# Patient Record
Sex: Female | Born: 1970 | Hispanic: No | Marital: Single | Smoking: Never smoker
Health system: Southern US, Community
[De-identification: ages and names within clinical notes are randomized; demographics above are authoritative.]

## PROBLEM LIST (undated history)

## (undated) DIAGNOSIS — J302 Other seasonal allergic rhinitis: Secondary | ICD-10-CM

## (undated) DIAGNOSIS — D219 Benign neoplasm of connective and other soft tissue, unspecified: Secondary | ICD-10-CM

## (undated) DIAGNOSIS — R51 Headache: Secondary | ICD-10-CM

## (undated) DIAGNOSIS — I10 Essential (primary) hypertension: Secondary | ICD-10-CM

## (undated) HISTORY — PX: NO PAST SURGERIES: SHX2092

---

## 2012-01-11 ENCOUNTER — Other Ambulatory Visit (HOSPITAL_COMMUNITY): Payer: Self-pay | Admitting: Obstetrics and Gynecology

## 2012-01-11 ENCOUNTER — Encounter (HOSPITAL_COMMUNITY): Payer: Self-pay | Admitting: *Deleted

## 2012-01-11 NOTE — H&P (Signed)
Yolanda Berry is an 41 y.o. female.gravida 3, para 2, Ab1, with worsening menorrhagia over the previous 5 months.  Menstrual cycles have been prolonged and heavy with clotting.  In addition, she has had dysmenorrhea ibuprofen has been used with with minimal relief.  Hysteroscopy and D&C has been done and the pathology is benign. Because of the prolonged menorrhagia and pelvic pain.  The patient has requested hysterectomy.  Ultrasound as revealed that she has fibroids, which are increasing in size.  The plan is for partial hysterectomy.  Risks, possible complications have been discussed and informed consent has been given.  Pertinent Gynecological History: Menses: flow is excessive with use of many pads or tampons on heaviest days Bleeding: heavy Contraception: Depo-Provera injections DES exposure: denies Blood transfusions: none Sexually transmitted diseases: no past history Previous GYN Procedures: hysteroscopy and D&C  Last mammogram: normal Date: 2013 Last pap: normal Date:2012   Menstrual History: LMP was 12/24/2011    Past Medical History  Diagnosis Date  . Fibroids   . Seasonal allergies   . Headache       Family history, hypertension, mother  And uncles, diabetes  Social History:  does not have a smoking history on file. She does not have any smokeless tobacco history on file. Her alcohol and drug histories not on file.  Allergies: no known allergy  Review of systems as noted, otherwise, noncontributory  Physical exam   general: well developed female, in no acute distress HEENT exam normal Chest clear S1 and S2 clear Back  normal without deviation or CVA tenderness Abdomen bowel sounds are present .  No masses palpated Uterus 10-12 weeks size Adnexa no masses, tenderness Cervix normal  Assessment parous female with enlarged uterus fibroids, menorrhagia, dysmenorrhea that has been worsening over the past 5 months.  Despite ibuprofen use and  Depo-Provera.  Plan hysterectomy    Fortino Sic 01/11/2012, 4:48 PM

## 2012-01-14 ENCOUNTER — Encounter (HOSPITAL_COMMUNITY): Payer: Self-pay | Admitting: Anesthesiology

## 2012-01-14 ENCOUNTER — Inpatient Hospital Stay (HOSPITAL_COMMUNITY): Payer: Medicaid Other | Admitting: Anesthesiology

## 2012-01-14 ENCOUNTER — Encounter (HOSPITAL_COMMUNITY)
Admission: RE | Disposition: A | Discharge status: Home / Self Care | Payer: Self-pay | Source: Ambulatory Visit | Attending: Obstetrics and Gynecology

## 2012-01-14 ENCOUNTER — Inpatient Hospital Stay (HOSPITAL_COMMUNITY)
Admission: RE | Admit: 2012-01-14 | Discharge: 2012-01-17 | DRG: 743 | Disposition: A | Discharge status: Home / Self Care | Payer: Medicaid Other | Source: Ambulatory Visit | Attending: Obstetrics and Gynecology | Admitting: Obstetrics and Gynecology

## 2012-01-14 ENCOUNTER — Encounter (HOSPITAL_COMMUNITY): Payer: Self-pay | Admitting: Obstetrics and Gynecology

## 2012-01-14 ENCOUNTER — Other Ambulatory Visit (HOSPITAL_COMMUNITY): Payer: Self-pay | Admitting: Obstetrics and Gynecology

## 2012-01-14 DIAGNOSIS — N8000 Endometriosis of the uterus, unspecified: Secondary | ICD-10-CM | POA: Diagnosis present

## 2012-01-14 DIAGNOSIS — I10 Essential (primary) hypertension: Secondary | ICD-10-CM | POA: Diagnosis present

## 2012-01-14 DIAGNOSIS — N920 Excessive and frequent menstruation with regular cycle: Principal | ICD-10-CM | POA: Diagnosis present

## 2012-01-14 DIAGNOSIS — Z90710 Acquired absence of both cervix and uterus: Secondary | ICD-10-CM

## 2012-01-14 DIAGNOSIS — N831 Corpus luteum cyst of ovary, unspecified side: Secondary | ICD-10-CM | POA: Diagnosis present

## 2012-01-14 DIAGNOSIS — D259 Leiomyoma of uterus, unspecified: Secondary | ICD-10-CM | POA: Diagnosis present

## 2012-01-14 DIAGNOSIS — N946 Dysmenorrhea, unspecified: Secondary | ICD-10-CM | POA: Diagnosis present

## 2012-01-14 HISTORY — PX: ABDOMINAL HYSTERECTOMY: SHX81

## 2012-01-14 HISTORY — DX: Benign neoplasm of connective and other soft tissue, unspecified: D21.9

## 2012-01-14 HISTORY — DX: Other seasonal allergic rhinitis: J30.2

## 2012-01-14 HISTORY — PX: SALPINGOOPHORECTOMY: SHX82

## 2012-01-14 HISTORY — DX: Headache: R51

## 2012-01-14 LAB — CBC
Hemoglobin: 12.7 g/dL (ref 12.0–15.0)
MCH: 29.5 pg (ref 26.0–34.0)
Platelets: 273 10*3/uL (ref 150–400)
RBC: 4.3 MIL/uL (ref 3.87–5.11)
WBC: 6.4 10*3/uL (ref 4.0–10.5)

## 2012-01-14 LAB — SURGICAL PCR SCREEN: Staphylococcus aureus: NEGATIVE

## 2012-01-14 LAB — SAMPLE TO BLOOD BANK

## 2012-01-14 SURGERY — HYSTERECTOMY, ABDOMINAL
Anesthesia: General | Site: Abdomen | Laterality: Right | Wound class: Clean Contaminated

## 2012-01-14 MED ORDER — GLYCOPYRROLATE 0.2 MG/ML IJ SOLN
INTRAMUSCULAR | Status: AC
  Filled 2012-01-14: qty 1

## 2012-01-14 MED ORDER — KETOROLAC TROMETHAMINE 30 MG/ML IJ SOLN: 15.0000 mg | Freq: Once | INTRAMUSCULAR | Status: DC | PRN

## 2012-01-14 MED ORDER — MIDAZOLAM HCL 5 MG/5ML IJ SOLN
INTRAMUSCULAR | Status: DC | PRN
  Administered 2012-01-14: 2 mg via INTRAVENOUS

## 2012-01-14 MED ORDER — LACTATED RINGERS IV SOLN
INTRAVENOUS | Status: DC
  Administered 2012-01-14 (×3): via INTRAVENOUS

## 2012-01-14 MED ORDER — ACETAMINOPHEN 160 MG/5ML PO SOLN
1000.0000 mg | ORAL | Status: AC
  Administered 2012-01-14: 1000 mg via ORAL
  Filled 2012-01-14: qty 40.6

## 2012-01-14 MED ORDER — MUPIROCIN 2 % EX OINT
TOPICAL_OINTMENT | Freq: Two times a day (BID) | CUTANEOUS | Status: DC
  Administered 2012-01-14: 1 via NASAL

## 2012-01-14 MED ORDER — HYDROMORPHONE HCL PF 1 MG/ML IJ SOLN
INTRAMUSCULAR | Status: AC
  Filled 2012-01-14: qty 1

## 2012-01-14 MED ORDER — ONDANSETRON HCL 4 MG/2ML IJ SOLN
INTRAMUSCULAR | Status: AC
  Filled 2012-01-14: qty 2

## 2012-01-14 MED ORDER — ONDANSETRON HCL 4 MG/2ML IJ SOLN
INTRAMUSCULAR | Status: DC | PRN
  Administered 2012-01-14: 4 mg via INTRAVENOUS

## 2012-01-14 MED ORDER — KETOROLAC TROMETHAMINE 30 MG/ML IJ SOLN
30.0000 mg | Freq: Once | INTRAMUSCULAR | Status: AC
  Administered 2012-01-14: 30 mg via INTRAVENOUS

## 2012-01-14 MED ORDER — FENTANYL CITRATE 0.05 MG/ML IJ SOLN
INTRAMUSCULAR | Status: AC
  Filled 2012-01-14: qty 5

## 2012-01-14 MED ORDER — PROPOFOL 10 MG/ML IV EMUL
INTRAVENOUS | Status: AC
  Filled 2012-01-14: qty 20

## 2012-01-14 MED ORDER — MORPHINE SULFATE 4 MG/ML IJ SOLN
1.0000 mg | INTRAMUSCULAR | Status: DC | PRN
  Administered 2012-01-14 – 2012-01-15 (×4): 2 mg via INTRAVENOUS
  Filled 2012-01-14 (×4): qty 1

## 2012-01-14 MED ORDER — PROPOFOL 10 MG/ML IV EMUL
INTRAVENOUS | Status: DC | PRN
  Administered 2012-01-14: 150 mg via INTRAVENOUS

## 2012-01-14 MED ORDER — ROCURONIUM BROMIDE 50 MG/5ML IV SOLN
INTRAVENOUS | Status: AC
  Filled 2012-01-14: qty 1

## 2012-01-14 MED ORDER — HYDROMORPHONE HCL PF 1 MG/ML IJ SOLN
0.2500 mg | INTRAMUSCULAR | Status: DC | PRN
  Administered 2012-01-14 (×3): 0.5 mg via INTRAVENOUS

## 2012-01-14 MED ORDER — NEOSTIGMINE METHYLSULFATE 1 MG/ML IJ SOLN
INTRAMUSCULAR | Status: DC | PRN
  Administered 2012-01-14: 2.5 mg via INTRAVENOUS

## 2012-01-14 MED ORDER — NEOSTIGMINE METHYLSULFATE 1 MG/ML IJ SOLN
INTRAMUSCULAR | Status: AC
  Filled 2012-01-14: qty 10

## 2012-01-14 MED ORDER — TEMAZEPAM 15 MG PO CAPS: 15.0000 mg | ORAL_CAPSULE | Freq: Every evening | ORAL | Status: DC | PRN

## 2012-01-14 MED ORDER — DEXTROSE IN LACTATED RINGERS 5 % IV SOLN
INTRAVENOUS | Status: DC
  Administered 2012-01-14 – 2012-01-15 (×3): via INTRAVENOUS

## 2012-01-14 MED ORDER — GLYCOPYRROLATE 0.2 MG/ML IJ SOLN
INTRAMUSCULAR | Status: DC | PRN
  Administered 2012-01-14: 0.4 mg via INTRAVENOUS

## 2012-01-14 MED ORDER — DEXAMETHASONE SODIUM PHOSPHATE 4 MG/ML IJ SOLN
INTRAMUSCULAR | Status: DC | PRN
  Administered 2012-01-14: 10 mg via INTRAVENOUS

## 2012-01-14 MED ORDER — BUPIVACAINE ON-Q PAIN PUMP (FOR ORDER SET NO CHG): INJECTION | Status: DC

## 2012-01-14 MED ORDER — CEFAZOLIN SODIUM-DEXTROSE 2-3 GM-% IV SOLR
INTRAVENOUS | Status: AC
  Filled 2012-01-14: qty 50

## 2012-01-14 MED ORDER — MENTHOL 3 MG MT LOZG
1.0000 | LOZENGE | OROMUCOSAL | Status: DC | PRN
  Administered 2012-01-14: 3 mg via ORAL
  Filled 2012-01-14: qty 9

## 2012-01-14 MED ORDER — HYDROMORPHONE HCL PF 1 MG/ML IJ SOLN
INTRAMUSCULAR | Status: DC | PRN
  Administered 2012-01-14 (×2): 1 mg via INTRAVENOUS

## 2012-01-14 MED ORDER — OXYCODONE-ACETAMINOPHEN 5-325 MG PO TABS: 1.0000 | ORAL_TABLET | ORAL | Status: DC | PRN

## 2012-01-14 MED ORDER — ROCURONIUM BROMIDE 100 MG/10ML IV SOLN
INTRAVENOUS | Status: DC | PRN
  Administered 2012-01-14: 40 mg via INTRAVENOUS
  Administered 2012-01-14: 10 mg via INTRAVENOUS

## 2012-01-14 MED ORDER — KETOROLAC TROMETHAMINE 30 MG/ML IJ SOLN
INTRAMUSCULAR | Status: AC
  Filled 2012-01-14: qty 1

## 2012-01-14 MED ORDER — MUPIROCIN 2 % EX OINT
TOPICAL_OINTMENT | CUTANEOUS | Status: AC
  Filled 2012-01-14: qty 22

## 2012-01-14 MED ORDER — KETOROLAC TROMETHAMINE 30 MG/ML IJ SOLN
INTRAMUSCULAR | Status: DC | PRN
  Administered 2012-01-14: 30 mg via INTRAVENOUS

## 2012-01-14 MED ORDER — IBUPROFEN 600 MG PO TABS: 600.0000 mg | ORAL_TABLET | Freq: Four times a day (QID) | ORAL | Status: DC | PRN

## 2012-01-14 MED ORDER — LIDOCAINE HCL (CARDIAC) 20 MG/ML IV SOLN
INTRAVENOUS | Status: DC | PRN
  Administered 2012-01-14: 50 mg via INTRAVENOUS

## 2012-01-14 MED ORDER — NITROGLYCERIN IN D5W 200-5 MCG/ML-% IV SOLN
INTRAVENOUS | Status: AC
  Filled 2012-01-14: qty 250

## 2012-01-14 MED ORDER — FENTANYL CITRATE 0.05 MG/ML IJ SOLN
INTRAMUSCULAR | Status: DC | PRN
  Administered 2012-01-14: 150 ug via INTRAVENOUS
  Administered 2012-01-14: 250 ug via INTRAVENOUS
  Administered 2012-01-14 (×2): 100 ug via INTRAVENOUS

## 2012-01-14 MED ORDER — OXYCODONE-ACETAMINOPHEN 5-325 MG/5ML PO SOLN
5.0000 mL | ORAL | Status: DC | PRN
  Administered 2012-01-14 – 2012-01-15 (×2): 10 mL via ORAL
  Filled 2012-01-14 (×2): qty 10

## 2012-01-14 MED ORDER — DEXAMETHASONE SODIUM PHOSPHATE 10 MG/ML IJ SOLN
INTRAMUSCULAR | Status: AC
  Filled 2012-01-14: qty 1

## 2012-01-14 MED ORDER — FENTANYL CITRATE 0.05 MG/ML IJ SOLN
INTRAMUSCULAR | Status: AC
  Filled 2012-01-14: qty 2

## 2012-01-14 MED ORDER — CEFAZOLIN SODIUM-DEXTROSE 2-3 GM-% IV SOLR
2.0000 g | INTRAVENOUS | Status: AC
  Administered 2012-01-14: 2 g via INTRAVENOUS

## 2012-01-14 MED ORDER — LIDOCAINE HCL (CARDIAC) 20 MG/ML IV SOLN
INTRAVENOUS | Status: AC
  Filled 2012-01-14: qty 5

## 2012-01-14 MED ORDER — MIDAZOLAM HCL 2 MG/2ML IJ SOLN
INTRAMUSCULAR | Status: AC
  Filled 2012-01-14: qty 2

## 2012-01-14 MED FILL — Nitroglycerin IV Soln 200 MCG/ML in D5W: INTRAVENOUS | Qty: 250 | Status: AC

## 2012-01-14 SURGICAL SUPPLY — 32 items
CANISTER SUCTION 2500CC (MISCELLANEOUS) ×3 IMPLANT
CHLORAPREP W/TINT 26ML (MISCELLANEOUS) ×3 IMPLANT
CLOTH BEACON ORANGE TIMEOUT ST (SAFETY) ×3 IMPLANT
CONT PATH 16OZ SNAP LID 3702 (MISCELLANEOUS) ×3 IMPLANT
DISSECTOR SPONGE CHERRY (GAUZE/BANDAGES/DRESSINGS) IMPLANT
DRESSING TELFA 8X3 (GAUZE/BANDAGES/DRESSINGS) ×3 IMPLANT
GAUZE SPONGE 4X4 12PLY STRL LF (GAUZE/BANDAGES/DRESSINGS) ×12 IMPLANT
GAUZE SPONGE 4X4 16PLY XRAY LF (GAUZE/BANDAGES/DRESSINGS) ×3 IMPLANT
GLOVE BIO SURGEON STRL SZ7 (GLOVE) ×3 IMPLANT
GLOVE BIOGEL PI IND STRL 7.0 (GLOVE) ×4 IMPLANT
GLOVE BIOGEL PI INDICATOR 7.0 (GLOVE) ×2
GOWN PREVENTION PLUS LG XLONG (DISPOSABLE) ×3 IMPLANT
GOWN PREVENTION PLUS XXLARGE (GOWN DISPOSABLE) ×3 IMPLANT
GOWN STRL REIN XL XLG (GOWN DISPOSABLE) ×6 IMPLANT
NS IRRIG 1000ML POUR BTL (IV SOLUTION) ×3 IMPLANT
PACK ABDOMINAL GYN (CUSTOM PROCEDURE TRAY) ×3 IMPLANT
PAD ABD 7.5X8 STRL (GAUZE/BANDAGES/DRESSINGS) ×3 IMPLANT
PAD OB MATERNITY 4.3X12.25 (PERSONAL CARE ITEMS) ×3 IMPLANT
PROTECTOR NERVE ULNAR (MISCELLANEOUS) ×3 IMPLANT
SPONGE LAP 18X18 X RAY DECT (DISPOSABLE) ×6 IMPLANT
STAPLER VISISTAT 35W (STAPLE) ×3 IMPLANT
SUT PLAIN 2 0 XLH (SUTURE) ×3 IMPLANT
SUT VIC AB 0 CT1 18XCR BRD8 (SUTURE) ×8 IMPLANT
SUT VIC AB 0 CT1 36 (SUTURE) ×6 IMPLANT
SUT VIC AB 0 CT1 8-18 (SUTURE) ×4
SUT VIC AB 2-0 CT1 (SUTURE) ×6 IMPLANT
SUT VIC AB 4-0 KS 27 (SUTURE) ×3 IMPLANT
SUT VICRYL 0 TIES 12 18 (SUTURE) ×3 IMPLANT
TAPE CLOTH SURG 4X10 WHT LF (GAUZE/BANDAGES/DRESSINGS) ×3 IMPLANT
TOWEL OR 17X24 6PK STRL BLUE (TOWEL DISPOSABLE) ×6 IMPLANT
TRAY FOLEY CATH 14FR (SET/KITS/TRAYS/PACK) ×3 IMPLANT
WATER STERILE IRR 1000ML POUR (IV SOLUTION) ×3 IMPLANT

## 2012-01-14 NOTE — H&P (Signed)
h & P updated. No change.

## 2012-01-14 NOTE — Anesthesia Postprocedure Evaluation (Signed)
  Anesthesia Post-op Note  Patient: Yolanda Berry  Procedure(s) Performed: Procedure(s) (LRB): HYSTERECTOMY ABDOMINAL (N/A) SALPINGO OOPHERECTOMY (Right)  Patient is awake, responsive, moving her legs, and has signs of resolution of her numbness. Pain and nausea are reasonably well controlled. Vital signs are stable and clinically acceptable. Oxygen saturation is clinically acceptable. There are no apparent anesthetic complications at this time. Patient is ready for discharge.

## 2012-01-14 NOTE — Op Note (Signed)
01/14/2012  1:57 PM  PATIENT indications and consent:  Yolanda Berry  41 y.o. femalewith the enlarging fibroids and worsening menorrhagia, hysteroscopy and D&C are performed to the pathology was benign.  The patient has requested him surgical treatment.  Because of the worsening periods as well as the enlarging fibroid uterus.  Patient has requested hysterectomy, wrist, possible complications have been discussed and informed consent has been given.  PRE-OPERATIVE DIAGNOSIS:  Menorrhagia and Fibroids  POST-OPERATIVE DIAGNOSIS:  Menorrhagia and Fibroids  PROCEDURE:  Procedure(s): HYSTERECTOMY ABDOMINAL,RSO  The patient was placed on the table in supine position.  General anesthesia was induced.  The abdomen was sterilely prepped and draped in the usual fashion.  A Pfannenstiel incision was made into the skin and extended through the abdominal layers without difficulty.  We entered the peritoneum and enlarged uterus present with multiple fibroids.  There was a large 6-7 cm fibroid on the right and that was intra-ligamentary displacing the uterus off to the patient's left.the procedure was initiated by placing a suture in the top of the uterine fundus and placed in traction.  The uterus.  The left round ligament was identified, clamped and tied, and the anterior leaf of the broad was entered.  The left adnexa.Then the left adnexal pedicle was clamped, cut, and doubly tied . The bladder was reflected downward on the left improvement in the right.  The right round ligament was stretched over the fibroid.  This was clamped, cut, and tied, and the peritoneum was reflected downward.  The right tube and ovary were draped over the large him fibroid and could not be saved therefore, the infundibulum ligament was identified and the peritoneum was entered and the infundibulopelvic ligament was tied after being cut off ties with 0 Vicryl suture.  A second suture was placed in this fibroid and was retracted up and we  carefully worked our way around this fibroid clamping cutting, time until we were able to reach to the cervix beneath the lateral was reflected downward on the right side as we approached the area.  We were dissecting around the fibroid.  We reached the cervix.  A straight Haney was placed on the pedicle and then was cut and tied and released.  He continued to work our way down to the cardinal ligament and cutting clamp.  After clamping, cutting and tying this we were able to sleep.  The cervix intercurrent Haney was placed on on Detrol, who went to the other side until the straight Haney on the uterine vessels.  These I until we worked our way down to the cervix limits Haney was placed on the cuff.  Then, using the heavy scissors.  The cuff was incised.  In the uterus and right tube and ovary was handed off.  Richardson angle stitches were placed bilaterally and the cuff was closed using interrupted figure-of-eight sutures.  Hemostasis was excellent.  The pelvis was irrigated and suctioned out.  The ureters were examined and peristalsed and appeared intact and away from the margins of the incision.  At this point, the peritoneum was closed with 2-0 Vicryl in a running fashion.  The fascia was closed with 1-0 Vicryl.  This was done in a running, locked fashion.  The subcutaneous was closed with 20 plain in a running fashion.  The skin was approximated with 4, Vicryl on Keith needle.  The procedure was terminated and the patient was taken to recovery room in good condition.   SURGEON:  Surgeon(s): Lou Miner  Neva Seat, MD  PHYSICIAN ASSISTANT: none  ASSISTANTS: none none  ANESTHESIA:   general  EBL:  Total I/O In: 1700 [I.V.:1700] Out: 650 [Urine:450; Blood:200]  BLOOD ADMINISTERED:none  DRAINS: Urinary Catheter (Foley)   LOCAL MEDICATIONS USED:  NONE  SPECIMEN:  Source of Specimen:  uterus, right tube and ovary  DISPOSITION OF SPECIMEN:  PATHOLOGY  COUNTS:  YES   DICTATION: .Dragon  Dictation  PLAN OF CARE: Admit to inpatient   PATIENT DISPOSITION:  PACU - hemodynamically stable.   Delay start of Pharmacological VTE agent (>24hrs) due to surgical blood loss or risk of bleeding:  {YES/NO/NOT APPLICABLE:20182

## 2012-01-14 NOTE — Anesthesia Preprocedure Evaluation (Signed)
Anesthesia Evaluation  Patient identified by MRN, date of birth, ID band Patient awake    Reviewed: Allergy & Precautions, H&P , NPO status , Patient's Chart, lab work & pertinent test results, reviewed documented beta blocker date and time   History of Anesthesia Complications Negative for: history of anesthetic complications  Airway Mallampati: II TM Distance: >3 FB Neck ROM: full    Dental  (+) Teeth Intact and Upper Dentures   Pulmonary neg pulmonary ROS,  breath sounds clear to auscultation  Pulmonary exam normal       Cardiovascular Exercise Tolerance: Good negative cardio ROS  Rhythm:regular Rate:Normal     Neuro/Psych negative neurological ROS  negative psych ROS   GI/Hepatic negative GI ROS, Neg liver ROS,   Endo/Other  negative endocrine ROS  Renal/GU negative Renal ROS     Musculoskeletal   Abdominal   Peds  Hematology negative hematology ROS (+)   Anesthesia Other Findings   Reproductive/Obstetrics negative OB ROS                           Anesthesia Physical Anesthesia Plan  ASA: I  Anesthesia Plan: General ETT   Post-op Pain Management:    Induction:   Airway Management Planned:   Additional Equipment:   Intra-op Plan:   Post-operative Plan:   Informed Consent: I have reviewed the patients History and Physical, chart, labs and discussed the procedure including the risks, benefits and alternatives for the proposed anesthesia with the patient or authorized representative who has indicated his/her understanding and acceptance.   Dental Advisory Given  Plan Discussed with: CRNA and Surgeon  Anesthesia Plan Comments:         Anesthesia Quick Evaluation

## 2012-01-14 NOTE — Anesthesia Procedure Notes (Signed)
Procedure Name: Intubation Date/Time: 01/14/2012 11:55 AM Performed by: Shanon Payor Pre-anesthesia Checklist: Suction available, Timeout performed, Emergency Drugs available, Patient identified and Patient being monitored Patient Re-evaluated:Patient Re-evaluated prior to inductionOxygen Delivery Method: Circle system utilized Preoxygenation: Pre-oxygenation with 100% oxygen Intubation Type: IV induction Ventilation: Mask ventilation without difficulty Laryngoscope Size: Mac and 3 Grade View: Grade II Tube type: Oral Tube size: 7.0 mm Number of attempts: 1 Airway Equipment and Method: Stylet Placement Confirmation: ETT inserted through vocal cords under direct vision,  positive ETCO2 and breath sounds checked- equal and bilateral Secured at: 20 cm Tube secured with: Tape Dental Injury: Teeth and Oropharynx as per pre-operative assessment

## 2012-01-14 NOTE — Progress Notes (Signed)
Pt has been given Dilaudid 0.5 mg IV x 3 doses(1500,1510,1525).  Patient must be moderately stimulated to assess pain score.  States pain is still 7/10 although she does not appear to be in severe pain.  Does grimace slightly with activity.  Patient was able to lift bottom of bed while putting on panties without showing signs of severe pain.  Patient resting with respiratory rate of 8.  Will continue to monitor.  No additional pain meds given at this time.

## 2012-01-14 NOTE — Transfer of Care (Signed)
Immediate Anesthesia Transfer of Care Note  Patient: Yolanda Berry  Procedure(s) Performed: Procedure(s) (LRB): HYSTERECTOMY ABDOMINAL (N/A) SALPINGO OOPHERECTOMY (Right)  Patient Location: PACU  Anesthesia Type: General  Level of Consciousness: awake, alert  and oriented  Airway & Oxygen Therapy: Patient Spontanous Breathing and Patient connected to nasal cannula oxygen  Post-op Assessment: Report given to PACU RN and Post -op Vital signs reviewed and stable  Post vital signs: Reviewed and stable  Complications: No apparent anesthesia complications

## 2012-01-14 NOTE — Progress Notes (Signed)
Dr Cristela Blue called re: pt's stated pain level.  Discussed alternatives to giving IV narcotics secondary to pt's sedation.  Orders received for Toradol 30 mg IV and Acetaminophen 1 gm PO.

## 2012-01-14 NOTE — Progress Notes (Signed)
Dr Cristela Blue notified of hypertension in PACU.  Last 3 BP readings in 170's/90 range.  No new orders received.  To notify if SBP>190.

## 2012-01-15 ENCOUNTER — Encounter (HOSPITAL_COMMUNITY): Payer: Self-pay | Admitting: Obstetrics and Gynecology

## 2012-01-15 LAB — CBC
HCT: 29.5 % — ABNORMAL LOW (ref 36.0–46.0)
Hemoglobin: 10 g/dL — ABNORMAL LOW (ref 12.0–15.0)
MCH: 30.6 pg (ref 26.0–34.0)
MCHC: 33.9 g/dL (ref 30.0–36.0)
MCV: 90.2 fL (ref 78.0–100.0)
Platelets: 256 10*3/uL (ref 150–400)
RBC: 3.27 MIL/uL — ABNORMAL LOW (ref 3.87–5.11)
RDW: 13.3 % (ref 11.5–15.5)
WBC: 10.1 10*3/uL (ref 4.0–10.5)

## 2012-01-15 MED ORDER — OXYCODONE-ACETAMINOPHEN 5-325 MG PO TABS
1.0000 | ORAL_TABLET | ORAL | Status: DC | PRN
  Administered 2012-01-15 (×3): 2 via ORAL
  Administered 2012-01-16 (×2): 1 via ORAL
  Filled 2012-01-15: qty 2
  Filled 2012-01-15 (×2): qty 1
  Filled 2012-01-15 (×2): qty 2

## 2012-01-15 MED ORDER — IBUPROFEN 600 MG PO TABS
600.0000 mg | ORAL_TABLET | Freq: Four times a day (QID) | ORAL | Status: DC | PRN
Start: 2012-01-15 — End: 2012-01-17
  Administered 2012-01-15 – 2012-01-16 (×4): 600 mg via ORAL
  Filled 2012-01-15 (×4): qty 1

## 2012-01-15 MED ORDER — IBUPROFEN 100 MG/5ML PO SUSP
600.0000 mg | Freq: Four times a day (QID) | ORAL | Status: DC | PRN
  Administered 2012-01-15: 600 mg via ORAL
  Filled 2012-01-15: qty 30

## 2012-01-15 MED ORDER — ONDANSETRON 8 MG PO TBDP
8.0000 mg | ORAL_TABLET | Freq: Two times a day (BID) | ORAL | Status: DC | PRN
  Administered 2012-01-15: 8 mg via ORAL
  Filled 2012-01-15: qty 1

## 2012-01-15 NOTE — Addendum Note (Signed)
Addendum  created 01/15/12 1320 by Randa Spike, CRNA   Modules edited:Notes Section

## 2012-01-15 NOTE — Progress Notes (Addendum)
1 Day Post-Op Procedure(s) (LRB): HYSTERECTOMY ABDOMINAL (N/A) SALPINGO OOPHERECTOMY (Right)  Subjective: Patient reports incisional pain.  And some abdominal pain esp associated with eating and was told to not force feed herself and she has not been out of bed and this was strongly encouraged  Objective: I have reviewed patient's vital signs, intake and output and labs.  General: alert, cooperative and mild distress Resp: clear to auscultation bilaterally Cardio: regular rate and rhythm, S1, S2 normal, no murmur, click, rub or gallop GI: appropriately tender and with minimal tympany but has a generalized tenderness throughout Extremities: extremities normal, atraumatic, no cyanosis or edema and Homans sign is negative, no sign of DVT  Assessment: s/p Procedure(s) (LRB): HYSTERECTOMY ABDOMINAL (N/A) SALPINGO OOPHERECTOMY (Right): stable  Plan: Encourage ambulation  LOS: 1 day    Princess Karnes H. 01/15/2012, 11:01 AM  CBC    Component Value Date/Time   WBC 10.1 01/15/2012 0510   RBC 3.27* 01/15/2012 0510   HGB 10.0* 01/15/2012 0510   HCT 29.5* 01/15/2012 0510   PLT 256 01/15/2012 0510   MCV 90.2 01/15/2012 0510   MCH 30.6 01/15/2012 0510   MCHC 33.9 01/15/2012 0510   RDW 13.3 01/15/2012 0510   Pathology Patient Vitals for the past 24 hrs:  BP Temp Temp src Pulse Resp SpO2 Height Weight  01/15/12 0954 173/93 mmHg 98.5 F (36.9 C) Oral 59  18  98 % - -  01/15/12 0617 152/92 mmHg 98.8 F (37.1 C) Oral 58  17  97 % - -  01/15/12 0200 140/88 mmHg 98.1 F (36.7 C) Oral 69  16  97 % - -  01/14/12 2148 142/91 mmHg 98.8 F (37.1 C) Oral 73  17  98 % - -  01/14/12 1759 149/86 mmHg 98.5 F (36.9 C) - 66  12  96 % 5' (1.524 m) 67.132 kg (148 lb)  01/14/12 1705 140/95 mmHg 98.5 F (36.9 C) - 85  10  99 % - -  01/14/12 1645 150/90 mmHg - - 70  10  95 % - -  01/14/12 1630 145/89 mmHg 98.6 F (37 C) - 70  10  95 % - -  01/14/12 1615 156/94 mmHg - - 111  9  100 % - -  01/14/12 1600  153/86 mmHg - - 74  9  100 % - -  01/14/12 1545 140/86 mmHg - - 70  9  100 % - -  01/14/12 1530 158/96 mmHg 99 F (37.2 C) - 71  7  99 % - -  01/14/12 1515 161/87 mmHg - - 67  9  100 % - -  01/14/12 1510 - - - 76  15  100 % - -  01/14/12 1500 168/90 mmHg - - 69  9  100 % - -  01/14/12 1445 174/94 mmHg - - 64  10  100 % - -  01/14/12 1430 180/88 mmHg - - 74  18  100 % - -  01/14/12 1421 170/90 mmHg 98.1 F (36.7 C) - 67  7  99 % - -    A: increased BP  P: monitor and may need therapy

## 2012-01-15 NOTE — Anesthesia Postprocedure Evaluation (Signed)
Alert and oriented, VS stable NO complaints of sore throat, myalgias or recall of surgery. Very happy with Anesthesia care

## 2012-01-15 NOTE — Progress Notes (Signed)
UR Chart review completed.  

## 2012-01-16 MED ORDER — BISACODYL 10 MG RE SUPP
10.0000 mg | Freq: Once | RECTAL | Status: AC
  Administered 2012-01-16: 10 mg via RECTAL
  Filled 2012-01-16: qty 1

## 2012-01-16 MED ORDER — LACTATED RINGERS IV BOLUS (SEPSIS)
1000.0000 mL | Freq: Once | INTRAVENOUS | Status: AC
  Administered 2012-01-16: 1000 mL via INTRAVENOUS

## 2012-01-16 NOTE — Progress Notes (Addendum)
2 Days Post-Op Procedure(s) (LRB): HYSTERECTOMY ABDOMINAL (N/A) SALPINGO OOPHERECTOMY (Right)  Subjective: Patient reports nausea, vomiting, tolerating PO and no problems voiding.  Emesis x 1 yesterday. Also c/o headache and states that she did void and ambulate and no flatus but was able to keep some food down Also has no IV fliuds   Objective: I have reviewed patient's vital signs, intake and output, medications and labs.  General: alert, cooperative and no distress Resp: clear to auscultation bilaterally Cardio: regular rate and rhythm, S1, S2 normal, no murmur, click, rub or gallop GI: soft, non-tender; bowel sounds normal; no masses,  no organomegaly and incision: clean, dry and intact Extremities: extremities normal, atraumatic, no cyanosis or edema Bowel sounds are hypoactive and no tympany noted either  Assessment: s/p Procedure(s) (LRB): HYSTERECTOMY ABDOMINAL (N/A) SALPINGO OOPHERECTOMY (Right): stable  Plan: Encourage ambulation and await return of GI function  LOS: 2 days   Bolus LR one liter Idil Maslanka H. 01/16/2012, 10:53 AM    Temp:  [98.1 F (36.7 C)-99 F (37.2 C)] 98.4 F (36.9 C) (08/21 0502) Pulse Rate:  [64-83] 80  (08/21 0502) Resp:  [16-18] 16  (08/21 0502) BP: (116-164)/(75-94) 146/92 mmHg (08/21 0502) SpO2:  [98 %-100 %] 100 % (08/21 0502)  CBC    Component Value Date/Time   WBC 10.1 01/15/2012 0510   RBC 3.27* 01/15/2012 0510   HGB 10.0* 01/15/2012 0510   HCT 29.5* 01/15/2012 0510   PLT 256 01/15/2012 0510   MCV 90.2 01/15/2012 0510   MCH 30.6 01/15/2012 0510   MCHC 33.9 01/15/2012 0510   RDW 13.3 01/15/2012 0510   I/O last 3 completed shifts: In: -  Out: 2375 [Urine:2375]

## 2012-01-17 MED ORDER — OXYCODONE-ACETAMINOPHEN 5-325 MG PO TABS: 1.0000 | ORAL_TABLET | ORAL | Status: AC | PRN

## 2012-01-17 NOTE — Progress Notes (Signed)
Patient ID: Yolanda Berry, female   DOB: [birth year], 41 y.o.   MRN: [id number] See discharge summary

## 2012-01-17 NOTE — Discharge Summary (Signed)
Physician Discharge Summary  Patient ID: Yolanda Berry MRN: [id number] DOB/AGE: 1970-08-21 41 y.o.  Admit date: 01/14/2012 Discharge date: 01/17/2012  Admission Diagnoses:enlarged uterus fibroids,  menorrhagia,  dysmenorrhea .  Planned hysterectomy  Principal procedure: TAH RSO  Discharge Diagnoses:  Active Problems:  * No active hospital problems. *   Same plus status post TAH RSO Transient hypertension postoperatively resolved Discharged Condition: good  Hospital Course:Yolanda Berry 41 y.o. femalewith the enlarging fibroids and worsening menorrhagia, hysteroscopy and D&C are performed to the pathology was benign. The patient has requested him surgical treatment. Because of the worsening periods as well as the enlarging fibroid uterus. Patient has requested hysterectomy, risk, possible complications have been discussed and informed consent has been given.   The patient underwent a TAH RSO without difficulty and had a recovery that was within normal limits.:  Difficulty she had very recovery was some blood pressures.  She has no history of hypertension.  This was monitored and seem to resolve as her pain control improved Consults: None  Significant Diagnostic Studies:  pathology pending  Treatments: surgery: TAH RSO  Discharge Exam: Blood pressure 118/67, pulse 82, temperature 98 F (36.7 C), temperature source Oral, resp. rate 17, height 5' (1.524 m), weight 67.132 kg (148 lb), SpO2 97.00%. General appearance: alert, cooperative and no distress Back: no tenderness to percussion or palpation, symmetric, no curvature. ROM normal. No CVA tenderness. Resp: clear to auscultation bilaterally Cardio: regular rate and rhythm, S1, S2 normal, no murmur, click, rub or gallop GI: soft, non-tender; bowel sounds normal; no masses,  no organomegaly and Appropriately tender Extremities: extremities normal, atraumatic, no cyanosis or edema, Homans sign is negative, no sign of DVT and no edema,  redness or tenderness in the calves or thighs Incision/Wound: with Steri-Strips and without evidence of infection or increased tenderness or discharge  Disposition: 01-Home or Self Care  Discharge Orders    Future Orders Please Complete By Expires   Diet - low sodium heart healthy      Increase activity slowly      Discharge instructions      Comments:   Pt counseled regarding activity and diet and lifting and followup   Driving Restrictions      Comments:   None for 2 weeks   Lifting restrictions      Comments:   Nothing you have to stop talking to lift   Sexual Activity Restrictions      Comments:   None for 2 weeks   Discharge wound care:      Comments:   Take steri strips off in 7 days if still there and call for any problems with the wound   Call MD for:  extreme fatigue      Call MD for:  persistant dizziness or light-headedness      Call MD for:  hives      Call MD for:  difficulty breathing, headache or visual disturbances      Call MD for:  redness, tenderness, or signs of infection (pain, swelling, redness, odor or green/yellow discharge around incision site)      Call MD for:  severe uncontrolled pain      Call MD for:  persistant nausea and vomiting      Call MD for:  temperature >100.4        Medication List  As of 01/17/2012  1:09 PM   STOP taking these medications         medroxyPROGESTERone 150 MG/ML injection  TAKE these medications         diphenhydrAMINE 25 MG tablet   Commonly known as: BENADRYL   Take 50 mg by mouth daily as needed. For allergies      ibuprofen 200 MG tablet   Commonly known as: ADVIL,MOTRIN   Take 200 mg by mouth every 6 (six) hours as needed. For pain      multivitamin with minerals Tabs   Take 1 tablet by mouth daily.      oxyCODONE-acetaminophen 5-325 MG per tablet   Commonly known as: PERCOCET/ROXICET   Take 1-2 tablets by mouth every 4 (four) hours as needed.             SignedDelbert Harness. 01/17/2012, 1:09 PM

## 2012-01-27 ENCOUNTER — Other Ambulatory Visit (HOSPITAL_COMMUNITY): Payer: Self-pay | Admitting: Obstetrics and Gynecology

## 2012-01-27 NOTE — H&P (Signed)
Fortino Sic, MD Physician Signed  H&P 01/11/2012 4:48 PM  Related encounter: Orders Only from 01/11/2012 in [facility]   Yolanda Berry is an 41 y.o. female.gravida 3, para 2, Ab1, with worsening menorrhagia over the previous 5 months. Menstrual cycles have been prolonged and heavy with clotting. In addition, she has had dysmenorrhea ibuprofen has been used with with minimal relief. Hysteroscopy and D&C has been done and the pathology is benign. Because of the prolonged menorrhagia and pelvic pain. The patient has requested hysterectomy. Ultrasound as revealed that she has fibroids, which are increasing in size. The plan is for partial hysterectomy. Risks, possible complications have been discussed and informed consent has been given.  Pertinent Gynecological History:  Menses: flow is excessive with use of many pads or tampons on heaviest days  Bleeding: heavy  Contraception: Depo-Provera injections  DES exposure: denies  Blood transfusions: none  Sexually transmitted diseases: no past history  Previous GYN Procedures: hysteroscopy and D&C  Last mammogram: normal Date: 2013  Last pap: normal Date:2012  Menstrual History:  LMP was 12/24/2011     Past Medical History    Diagnosis  Date    .  Fibroids     .  Seasonal allergies     .  Headache      Family history, hypertension, mother  And uncles, diabetes  Social History: does not have a smoking history on file. She does not have any smokeless tobacco history on file. Her alcohol and drug histories not on file.  Allergies: no known allergy  Review of systems as noted, otherwise, noncontributory  Physical exam  general: well developed female, in no acute distress  HEENT exam normal  Chest clear  S1 and S2 clear  Back  normal without deviation or CVA tenderness  Abdomen bowel sounds are present  . No masses palpated  Uterus 10-12 weeks size  Adnexa no masses, tenderness  Cervix normal  Assessment parous female with  enlarged uterus fibroids, menorrhagia, dysmenorrhea that has been worsening over the past 5 months. Despite ibuprofen use and Depo-Provera.  Plan hysterectomy  Fortino Sic  01/11/2012, 4:48 PM

## 2013-10-07 ENCOUNTER — Emergency Department (HOSPITAL_COMMUNITY): Payer: Medicaid Other

## 2013-10-07 ENCOUNTER — Encounter (HOSPITAL_COMMUNITY): Payer: Self-pay | Admitting: Emergency Medicine

## 2013-10-07 DIAGNOSIS — R079 Chest pain, unspecified: Secondary | ICD-10-CM | POA: Insufficient documentation

## 2013-10-07 LAB — CBC
HEMATOCRIT: 35.6 % — AB (ref 36.0–46.0)
HEMOGLOBIN: 12 g/dL (ref 12.0–15.0)
MCH: 30.4 pg (ref 26.0–34.0)
MCHC: 33.7 g/dL (ref 30.0–36.0)
MCV: 90.1 fL (ref 78.0–100.0)
Platelets: 242 10*3/uL (ref 150–400)
RBC: 3.95 MIL/uL (ref 3.87–5.11)
RDW: 13.2 % (ref 11.5–15.5)
WBC: 7.2 10*3/uL (ref 4.0–10.5)

## 2013-10-07 LAB — BASIC METABOLIC PANEL
BUN: 12 mg/dL (ref 6–23)
CHLORIDE: 104 meq/L (ref 96–112)
CO2: 21 mEq/L (ref 19–32)
Calcium: 8.8 mg/dL (ref 8.4–10.5)
Creatinine, Ser: 0.88 mg/dL (ref 0.50–1.10)
GFR calc non Af Amer: 79 mL/min — ABNORMAL LOW (ref >90)
Glucose, Bld: 91 mg/dL (ref 70–99)
POTASSIUM: 3.8 meq/L (ref 3.7–5.3)
Sodium: 138 mEq/L (ref 137–147)

## 2013-10-07 LAB — I-STAT TROPONIN, ED: Troponin i, poc: 0 ng/mL (ref 0.00–0.08)

## 2013-10-07 NOTE — ED Notes (Signed)
Pt sattes burning in her central chest that started while she was sitting at home.

## 2013-10-08 ENCOUNTER — Emergency Department (HOSPITAL_COMMUNITY)
Admission: EM | Admit: 2013-10-08 | Discharge: 2013-10-08 | Discharge status: Against Medical Advice | Payer: Medicaid Other | Attending: Emergency Medicine | Admitting: Emergency Medicine

## 2013-11-26 ENCOUNTER — Encounter (HOSPITAL_COMMUNITY): Payer: Self-pay | Admitting: Emergency Medicine

## 2013-11-26 ENCOUNTER — Emergency Department (HOSPITAL_COMMUNITY)
Admission: EM | Admit: 2013-11-26 | Discharge: 2013-11-26 | Disposition: A | Discharge status: Home / Self Care | Payer: Medicaid Other | Attending: Emergency Medicine | Admitting: Emergency Medicine

## 2013-11-26 DIAGNOSIS — Z8542 Personal history of malignant neoplasm of other parts of uterus: Secondary | ICD-10-CM | POA: Insufficient documentation

## 2013-11-26 DIAGNOSIS — R110 Nausea: Secondary | ICD-10-CM | POA: Insufficient documentation

## 2013-11-26 DIAGNOSIS — G43911 Migraine, unspecified, intractable, with status migrainosus: Secondary | ICD-10-CM | POA: Diagnosis not present

## 2013-11-26 MED ORDER — METOCLOPRAMIDE HCL 5 MG/ML IJ SOLN
10.0000 mg | Freq: Once | INTRAMUSCULAR | Status: AC
  Administered 2013-11-26: 10 mg via INTRAVENOUS
  Filled 2013-11-26: qty 2

## 2013-11-26 MED ORDER — DIPHENHYDRAMINE HCL 50 MG/ML IJ SOLN
25.0000 mg | Freq: Once | INTRAMUSCULAR | Status: AC
  Administered 2013-11-26: 25 mg via INTRAVENOUS
  Filled 2013-11-26: qty 1

## 2013-11-26 MED ORDER — DEXAMETHASONE SODIUM PHOSPHATE 10 MG/ML IJ SOLN
10.0000 mg | Freq: Once | INTRAMUSCULAR | Status: AC
  Administered 2013-11-26: 10 mg via INTRAVENOUS
  Filled 2013-11-26: qty 1

## 2013-11-26 NOTE — Discharge Instructions (Signed)

## 2013-11-26 NOTE — Progress Notes (Signed)
[facility] provided pt with a list of primary care resources to help patient establish a pcp.  °

## 2013-11-26 NOTE — ED Provider Notes (Addendum)
CSN: [id number]     Arrival date & time 11/26/13  1021 History   First MD Initiated Contact with Patient 11/26/13 1035     Chief Complaint  Patient presents with  . Migraine     (Consider location/radiation/quality/duration/timing/severity/associated sxs/prior Treatment) Patient is a 43 y.o. female presenting with migraines. The history is provided by the patient.  Migraine This is a recurrent problem. Episode onset: 1 week. The problem occurs constantly. The problem has been gradually worsening. Associated symptoms include headaches. Associated symptoms comments: Photosensitivity, nausea, eyes watering.  No vomiting, fever, neck pain or focal weakness/numbness. Exacerbated by: bright light. Nothing relieves the symptoms. Treatments tried: NSAIDs. The treatment provided no relief.    Past Medical History  Diagnosis Date  . Fibroids   . Seasonal allergies   . MHDQQIWL(798.9)    Past Surgical History  Procedure Laterality Date  . No past surgeries    . Abdominal hysterectomy  01/14/2012    Procedure: HYSTERECTOMY ABDOMINAL;  Surgeon: Avel Sensor, MD;  Location: [facility];  Service: Gynecology;  Laterality: N/A;  . Salpingoophorectomy  01/14/2012    Procedure: SALPINGO OOPHERECTOMY;  Surgeon: Avel Sensor, MD;  Location: [facility];  Service: Gynecology;  Laterality: Right;   No family history on file. History  Substance Use Topics  . Smoking status: Never Smoker   . Smokeless tobacco: Never Used  . Alcohol Use: No   OB History   Grav Para Term Preterm Abortions TAB SAB Ect Mult Living                 Review of Systems  Neurological: Positive for headaches.  All other systems reviewed and are negative.     Allergies  Review of patient's allergies indicates no known allergies.  Home Medications   Prior to Admission medications   Medication Sig Start Date End Date Taking? Authorizing Provider  diphenhydrAMINE (BENADRYL) 25 MG tablet Take 50 mg by mouth daily as  needed. For allergies    Historical Provider, MD   BP 127/88  Pulse 82  Temp(Src) 98.5 F (36.9 C) (Oral)  Resp 17  SpO2 99% Physical Exam  Nursing note and vitals reviewed. Constitutional: She is oriented to person, place, and time. She appears well-developed and well-nourished. No distress.  HENT:  Head: Normocephalic and atraumatic.  Eyes: EOM are normal. Pupils are equal, round, and reactive to light.  Fundoscopic exam:      The right eye shows no papilledema.       The left eye shows no papilledema.  Neck: Normal range of motion. Neck supple.  Cardiovascular: Normal rate, regular rhythm, normal heart sounds and intact distal pulses.  Exam reveals no friction rub.   No murmur heard. Pulmonary/Chest: Effort normal and breath sounds normal. She has no wheezes. She has no rales.  Musculoskeletal: Normal range of motion. She exhibits no tenderness.  No edema  Lymphadenopathy:    She has no cervical adenopathy.  Neurological: She is alert and oriented to person, place, and time. She has normal strength. No cranial nerve deficit or sensory deficit. Gait normal.  photophobia  Skin: Skin is warm and dry. No rash noted.  Psychiatric: She has a normal mood and affect. Her behavior is normal.    ED Course  Procedures (including critical care time) Labs Review Labs Reviewed - No data to display  Imaging Review No results found.   EKG Interpretation None      MDM   Final diagnoses:  Intractable migraine  with status migrainosus, unspecified migraine type    Pt with typical migraine HA without sx suggestive of SAH(sudden onset, worst of life, or deficits), infection, or cavernous vein thrombosis.  Normal neuro exam and vital signs. Will give HA cocktail and re-eval.   11:56 AM Pt feeling much better and will d/c home.  Blanchie Dessert, MD 11/26/13 [place], MD 11/26/13 1157

## 2013-11-26 NOTE — ED Notes (Signed)
Pt c/o migraine for about week now.  Pt states that it started on the right side but has now moved to the left side.  Pt states that she has taken her meds but not working.

## 2014-09-15 ENCOUNTER — Emergency Department (HOSPITAL_COMMUNITY): Payer: Medicaid Other

## 2014-09-15 ENCOUNTER — Emergency Department (HOSPITAL_COMMUNITY)
Admission: EM | Admit: 2014-09-15 | Discharge: 2014-09-16 | Disposition: A | Discharge status: Home / Self Care | Payer: Medicaid Other | Attending: Emergency Medicine | Admitting: Emergency Medicine

## 2014-09-15 ENCOUNTER — Encounter (HOSPITAL_COMMUNITY): Payer: Self-pay

## 2014-09-15 DIAGNOSIS — R1031 Right lower quadrant pain: Secondary | ICD-10-CM | POA: Diagnosis present

## 2014-09-15 DIAGNOSIS — Z79899 Other long term (current) drug therapy: Secondary | ICD-10-CM | POA: Diagnosis not present

## 2014-09-15 DIAGNOSIS — Z86018 Personal history of other benign neoplasm: Secondary | ICD-10-CM | POA: Diagnosis not present

## 2014-09-15 DIAGNOSIS — R109 Unspecified abdominal pain: Secondary | ICD-10-CM

## 2014-09-15 DIAGNOSIS — N83202 Unspecified ovarian cyst, left side: Secondary | ICD-10-CM

## 2014-09-15 DIAGNOSIS — N8320 Unspecified ovarian cysts: Secondary | ICD-10-CM | POA: Diagnosis not present

## 2014-09-15 DIAGNOSIS — Z8709 Personal history of other diseases of the respiratory system: Secondary | ICD-10-CM | POA: Insufficient documentation

## 2014-09-15 LAB — URINALYSIS, ROUTINE W REFLEX MICROSCOPIC
Bilirubin Urine: NEGATIVE
GLUCOSE, UA: NEGATIVE mg/dL
Hgb urine dipstick: NEGATIVE
KETONES UR: NEGATIVE mg/dL
Leukocytes, UA: NEGATIVE
NITRITE: NEGATIVE
PH: 6 (ref 5.0–8.0)
Protein, ur: NEGATIVE mg/dL
SPECIFIC GRAVITY, URINE: 1.02 (ref 1.005–1.030)
UROBILINOGEN UA: 1 mg/dL (ref 0.0–1.0)

## 2014-09-15 LAB — COMPREHENSIVE METABOLIC PANEL
ALBUMIN: 4 g/dL (ref 3.5–5.2)
ALT: 15 U/L (ref 0–35)
AST: 15 U/L (ref 0–37)
Alkaline Phosphatase: 37 U/L — ABNORMAL LOW (ref 39–117)
Anion gap: 7 (ref 5–15)
BILIRUBIN TOTAL: 0.3 mg/dL (ref 0.3–1.2)
BUN: 14 mg/dL (ref 6–23)
CHLORIDE: 107 mmol/L (ref 96–112)
CO2: 25 mmol/L (ref 19–32)
CREATININE: 0.89 mg/dL (ref 0.50–1.10)
Calcium: 8.8 mg/dL (ref 8.4–10.5)
GFR calc Af Amer: >90 mL/min (ref >90)
GFR calc non Af Amer: 78 mL/min — ABNORMAL LOW (ref >90)
Glucose, Bld: 84 mg/dL (ref 70–99)
POTASSIUM: 3.8 mmol/L (ref 3.5–5.1)
Sodium: 139 mmol/L (ref 135–145)
TOTAL PROTEIN: 6.7 g/dL (ref 6.0–8.3)

## 2014-09-15 LAB — CBC WITH DIFFERENTIAL/PLATELET
Basophils Absolute: 0 10*3/uL (ref 0.0–0.1)
Basophils Relative: 1 % (ref 0–1)
Eosinophils Absolute: 0.2 10*3/uL (ref 0.0–0.7)
Eosinophils Relative: 3 % (ref 0–5)
HCT: 36.4 % (ref 36.0–46.0)
HEMOGLOBIN: 12 g/dL (ref 12.0–15.0)
LYMPHS ABS: 3.8 10*3/uL (ref 0.7–4.0)
LYMPHS PCT: 49 % — AB (ref 12–46)
MCH: 30.5 pg (ref 26.0–34.0)
MCHC: 33 g/dL (ref 30.0–36.0)
MCV: 92.4 fL (ref 78.0–100.0)
MONOS PCT: 5 % (ref 3–12)
Monocytes Absolute: 0.4 10*3/uL (ref 0.1–1.0)
NEUTROS ABS: 3.2 10*3/uL (ref 1.7–7.7)
NEUTROS PCT: 42 % — AB (ref 43–77)
PLATELETS: 258 10*3/uL (ref 150–400)
RBC: 3.94 MIL/uL (ref 3.87–5.11)
RDW: 13.4 % (ref 11.5–15.5)
WBC: 7.7 10*3/uL (ref 4.0–10.5)

## 2014-09-15 LAB — LIPASE, BLOOD: Lipase: 30 U/L (ref 11–59)

## 2014-09-15 MED ORDER — ONDANSETRON HCL 4 MG/2ML IJ SOLN
4.0000 mg | Freq: Once | INTRAMUSCULAR | Status: AC
  Administered 2014-09-15: 4 mg via INTRAVENOUS
  Filled 2014-09-15: qty 2

## 2014-09-15 MED ORDER — IOHEXOL 300 MG/ML  SOLN
100.0000 mL | Freq: Once | INTRAMUSCULAR | Status: AC | PRN
  Administered 2014-09-15: 100 mL via INTRAVENOUS

## 2014-09-15 MED ORDER — MORPHINE SULFATE 4 MG/ML IJ SOLN
4.0000 mg | Freq: Once | INTRAMUSCULAR | Status: AC
  Administered 2014-09-15: 4 mg via INTRAVENOUS
  Filled 2014-09-15: qty 1

## 2014-09-15 NOTE — ED Notes (Signed)
Pt states abdominal pain x 1 week.  Pt states no fever, no n/v.  Pt states pain is causing difficulty having a bm.  Although last bm was yesterday.  No change in urination.  No vaginal discharge.

## 2014-09-15 NOTE — ED Notes (Signed)
Pain in abdomen, RUQ for 1 week and a half. Pain increased over time. Discomfort with BM. Denies N/V/D

## 2014-09-15 NOTE — ED Provider Notes (Signed)
CSN: [id number]     Arrival date & time 09/15/14  1734 History   First MD Initiated Contact with Patient 09/15/14 2157     Chief Complaint  Patient presents with  . Abdominal Pain     (Consider location/radiation/quality/duration/timing/severity/associated sxs/prior Treatment) HPI Comments: Patient presents emergency department with chief complaints of abdominal pain times one week. She states the symptoms are progressively worsening. She reports no fevers, nausea, or vomiting. She states the pain has worsened with palpation and walking, particularly in the right lower quadrant. Patient reports prior abdominal surgeries including total abdominal hysterectomy. She has not taken anything to alleviate her symptoms. She denies any diarrhea, constipation, dysuria, or vaginal discharge.  The history is provided by the patient. No language interpreter was used.    Past Medical History  Diagnosis Date  . Fibroids   . Seasonal allergies   . UXLKGMWN(027.2)    Past Surgical History  Procedure Laterality Date  . No past surgeries    . Abdominal hysterectomy  01/14/2012    Procedure: HYSTERECTOMY ABDOMINAL;  Surgeon: Avel Sensor, MD;  Location: [facility];  Service: Gynecology;  Laterality: N/A;  . Salpingoophorectomy  01/14/2012    Procedure: SALPINGO OOPHERECTOMY;  Surgeon: Avel Sensor, MD;  Location: [facility];  Service: Gynecology;  Laterality: Right;   History reviewed. No pertinent family history. History  Substance Use Topics  . Smoking status: Never Smoker   . Smokeless tobacco: Never Used  . Alcohol Use: No   OB History    No data available     Review of Systems  Constitutional: Negative for fever and chills.  Respiratory: Negative for shortness of breath.   Cardiovascular: Negative for chest pain.  Gastrointestinal: Positive for abdominal pain. Negative for nausea, vomiting, diarrhea and constipation.  Genitourinary: Negative for dysuria.  All other systems reviewed and  are negative.     Allergies  Review of patient's allergies indicates no known allergies.  Home Medications   Prior to Admission medications   Medication Sig Start Date End Date Taking? Authorizing Provider  Aspirin-Salicylamide-Caffeine 536-64-40 MG TABS Take 1 each by mouth as needed (pain).   Yes Historical Provider, MD  diphenhydrAMINE (BENADRYL) 25 MG tablet Take 50 mg by mouth daily as needed. For allergies   Yes Historical Provider, MD  ibuprofen (ADVIL,MOTRIN) 200 MG tablet Take 400 mg by mouth every 4 (four) hours as needed for moderate pain.   Yes Historical Provider, MD   BP 140/79 mmHg  Pulse 70  Temp(Src) 98.2 F (36.8 C) (Oral)  Resp 16  SpO2 100% Physical Exam  Constitutional: She is oriented to person, place, and time. She appears well-developed and well-nourished.  HENT:  Head: Normocephalic and atraumatic.  Eyes: Conjunctivae and EOM are normal. Pupils are equal, round, and reactive to light.  Neck: Normal range of motion. Neck supple.  Cardiovascular: Normal rate and regular rhythm.  Exam reveals no gallop and no friction rub.   No murmur heard. Pulmonary/Chest: Effort normal and breath sounds normal. No respiratory distress. She has no wheezes. She has no rales. She exhibits no tenderness.  Abdominal: Soft. Bowel sounds are normal. She exhibits no distension and no mass. There is tenderness. There is no rebound and no guarding.  Focal right lower quadrant tenderness  Musculoskeletal: Normal range of motion. She exhibits no edema or tenderness.  Neurological: She is alert and oriented to person, place, and time.  Skin: Skin is warm and dry.  Psychiatric: She has a normal mood  and affect. Her behavior is normal. Judgment and thought content normal.  Nursing note and vitals reviewed.   ED Course  Procedures (including critical care time) Results for orders placed or performed during the hospital encounter of 09/15/14  CBC with Differential  Result Value Ref  Range   WBC 7.7 4.0 - 10.5 K/uL   RBC 3.94 3.87 - 5.11 MIL/uL   Hemoglobin 12.0 12.0 - 15.0 g/dL   HCT 36.4 36.0 - 46.0 %   MCV 92.4 78.0 - 100.0 fL   MCH 30.5 26.0 - 34.0 pg   MCHC 33.0 30.0 - 36.0 g/dL   RDW 13.4 11.5 - 15.5 %   Platelets 258 150 - 400 K/uL   Neutrophils Relative % 42 (L) 43 - 77 %   Neutro Abs 3.2 1.7 - 7.7 K/uL   Lymphocytes Relative 49 (H) 12 - 46 %   Lymphs Abs 3.8 0.7 - 4.0 K/uL   Monocytes Relative 5 3 - 12 %   Monocytes Absolute 0.4 0.1 - 1.0 K/uL   Eosinophils Relative 3 0 - 5 %   Eosinophils Absolute 0.2 0.0 - 0.7 K/uL   Basophils Relative 1 0 - 1 %   Basophils Absolute 0.0 0.0 - 0.1 K/uL  Comprehensive metabolic panel  Result Value Ref Range   Sodium 139 135 - 145 mmol/L   Potassium 3.8 3.5 - 5.1 mmol/L   Chloride 107 96 - 112 mmol/L   CO2 25 19 - 32 mmol/L   Glucose, Bld 84 70 - 99 mg/dL   BUN 14 6 - 23 mg/dL   Creatinine, Ser 0.89 0.50 - 1.10 mg/dL   Calcium 8.8 8.4 - 10.5 mg/dL   Total Protein 6.7 6.0 - 8.3 g/dL   Albumin 4.0 3.5 - 5.2 g/dL   AST 15 0 - 37 U/L   ALT 15 0 - 35 U/L   Alkaline Phosphatase 37 (L) 39 - 117 U/L   Total Bilirubin 0.3 0.3 - 1.2 mg/dL   GFR calc non Af Amer 78 (L) >90 mL/min   GFR calc Af Amer >90 >90 mL/min   Anion gap 7 5 - 15  Lipase, blood  Result Value Ref Range   Lipase 30 11 - 59 U/L  Urinalysis, Routine w reflex microscopic  Result Value Ref Range   Color, Urine YELLOW YELLOW   APPearance CLOUDY (A) CLEAR   Specific Gravity, Urine 1.020 1.005 - 1.030   pH 6.0 5.0 - 8.0   Glucose, UA NEGATIVE NEGATIVE mg/dL   Hgb urine dipstick NEGATIVE NEGATIVE   Bilirubin Urine NEGATIVE NEGATIVE   Ketones, ur NEGATIVE NEGATIVE mg/dL   Protein, ur NEGATIVE NEGATIVE mg/dL   Urobilinogen, UA 1.0 0.0 - 1.0 mg/dL   Nitrite NEGATIVE NEGATIVE   Leukocytes, UA NEGATIVE NEGATIVE   Ct Abdomen Pelvis W Contrast  09/15/2014   CLINICAL DATA:  Right upper quadrant pain for 1-1/2 week.  EXAM: CT ABDOMEN AND PELVIS WITH  CONTRAST  TECHNIQUE: Multidetector CT imaging of the abdomen and pelvis was performed using the standard protocol following bolus administration of intravenous contrast.  CONTRAST:  19mL OMNIPAQUE IOHEXOL 300 MG/ML  SOLN  COMPARISON:  None.  FINDINGS: BODY WALL: No contributory findings.  LOWER CHEST:  ABDOMEN/PELVIS:  Liver: Few sub cm low-density foci in the liver are too small for characterization but statistically benign in this patient without malignancy history.  Biliary: No evidence of biliary obstruction or stone.  Pancreas: Unremarkable.  Spleen: Unremarkable.  Adrenals: Unremarkable.  Kidneys  and ureters: Low densities in the left renal cortex too small to characterize. No hydronephrosis or stone.  Bladder: Unremarkable.  Reproductive: Hysterectomy and possible right oophorectomy. Left ovary contains a 3 cm cyst which has high density material dependently, best visualized on reformatted imaging. No evidence of active hemorrhage. There is small volume but mildly complex density fluid in the pelvis which is likely hemo peritoneum related to the left ovary.  Bowel: No obstruction. Normal appendix.  Retroperitoneum: No mass or adenopathy.  Peritoneum: Intermediate density but small volume pelvic fluid.  Vascular: No acute abnormality.  OSSEOUS: No acute abnormalities.  IMPRESSION: Findings suggest leaking hemorrhagic cyst in the left ovary. Recommend pelvic ultrasound in 6 weeks to document normalization.   Electronically Signed   By: Monte Fantasia M.D.   On: 09/15/2014 23:59     Imaging Review No results found.   EKG Interpretation None      MDM   Final diagnoses:  Abdominal pain  Cyst of left ovary   Patient with progressively worsening right lower quadrant abdominal tenderness. Labs are reassuring, but the amount of pain that is reproducible with palpation of the abdomen is concerning. Will check CT scan to rule out appendicitis.  CT negative for appy.  It would appear that the  patient did not have a total hysterectomy, but the left ovary remains and is remarkable for a hemorrhagic cyst.  Vital signs are stable, labs are reassuring.  Will recommend follow-up with OBGYN for [place] in 6 weeks.  Will give strict abdominal return precautions.  Patient understands and agrees with the plan.  She is stable and ready for discharge.    Montine Circle, PA-C 09/16/14 0023  Sherwood Gambler, MD 09/18/14 [phone]

## 2014-09-16 MED ORDER — HYDROCODONE-ACETAMINOPHEN 5-325 MG PO TABS: 1.0000 | ORAL_TABLET | Freq: Four times a day (QID) | ORAL | Status: DC | PRN

## 2014-09-16 MED ORDER — HYDROCODONE-ACETAMINOPHEN 5-325 MG PO TABS
2.0000 | ORAL_TABLET | Freq: Once | ORAL | Status: AC
  Administered 2014-09-16: 2 via ORAL
  Filled 2014-09-16: qty 2

## 2014-09-16 NOTE — Discharge Instructions (Signed)
Abdominal Pain, Women °Abdominal (stomach, pelvic, or belly) pain can be caused by many things. It is important to tell your doctor: °· The location of the pain. °· Does it come and go or is it present all the time? °· Are there things that start the pain (eating certain foods, exercise)? °· Are there other symptoms associated with the pain (fever, nausea, vomiting, diarrhea)? °All of this is helpful to know when trying to find the cause of the pain. °CAUSES  °· Stomach: virus or bacteria infection, or ulcer. °· Intestine: appendicitis (inflamed appendix), regional ileitis (Crohn's disease), ulcerative colitis (inflamed colon), irritable bowel syndrome, diverticulitis (inflamed diverticulum of the colon), or cancer of the stomach or intestine. °· Gallbladder disease or stones in the gallbladder. °· Kidney disease, kidney stones, or infection. °· Pancreas infection or cancer. °· Fibromyalgia (pain disorder). °· Diseases of the female organs: °¨ Uterus: fibroid (non-cancerous) tumors or infection. °¨ Fallopian tubes: infection or tubal pregnancy. °¨ Ovary: cysts or tumors. °¨ Pelvic adhesions (scar tissue). °¨ Endometriosis (uterus lining tissue growing in the pelvis and on the pelvic organs). °¨ Pelvic congestion syndrome (female organs filling up with blood just before the menstrual period). °¨ Pain with the menstrual period. °¨ Pain with ovulation (producing an egg). °¨ Pain with an IUD (intrauterine device, birth control) in the uterus. °¨ Cancer of the female organs. °· Functional pain (pain not caused by a disease, may improve without treatment). °· Psychological pain. °· Depression. °DIAGNOSIS  °Your doctor will decide the seriousness of your pain by doing an examination. °· Blood tests. °· X-rays. °· Ultrasound. °· CT scan (computed tomography, special type of X-ray). °· MRI (magnetic resonance imaging). °· Cultures, for infection. °· Barium enema (dye inserted in the large intestine, to better view it with  X-rays). °· Colonoscopy (looking in intestine with a lighted tube). °· Laparoscopy (minor surgery, looking in abdomen with a lighted tube). °· Major abdominal exploratory surgery (looking in abdomen with a large incision). °TREATMENT  °The treatment will depend on the cause of the pain.  °· Many cases can be observed and treated at home. °· Over-the-counter medicines recommended by your caregiver. °· Prescription medicine. °· Antibiotics, for infection. °· Birth control pills, for painful periods or for ovulation pain. °· Hormone treatment, for endometriosis. °· Nerve blocking injections. °· Physical therapy. °· Antidepressants. °· Counseling with a psychologist or psychiatrist. °· Minor or major surgery. °HOME CARE INSTRUCTIONS  °· Do not take laxatives, unless directed by your caregiver. °· Take over-the-counter pain medicine only if ordered by your caregiver. Do not take aspirin because it can cause an upset stomach or bleeding. °· Try a clear liquid diet (broth or water) as ordered by your caregiver. Slowly move to a bland diet, as tolerated, if the pain is related to the stomach or intestine. °· Have a thermometer and take your temperature several times a day, and record it. °· Bed rest and sleep, if it helps the pain. °· Avoid sexual intercourse, if it causes pain. °· Avoid stressful situations. °· Keep your follow-up appointments and tests, as your caregiver orders. °· If the pain does not go away with medicine or surgery, you may try: °¨ Acupuncture. °¨ Relaxation exercises (yoga, meditation). °¨ Group therapy. °¨ Counseling. °SEEK MEDICAL CARE IF:  °· You notice certain foods cause stomach pain. °· Your home care treatment is not helping your pain. °· You need stronger pain medicine. °· You want your IUD removed. °· You feel faint or   lightheaded. °· You develop nausea and vomiting. °· You develop a rash. °· You are having side effects or an allergy to your medicine. °SEEK IMMEDIATE MEDICAL CARE IF:  °· Your  pain does not go away or gets worse. °· You have a fever. °· Your pain is felt only in portions of the abdomen. The right side could possibly be appendicitis. The left lower portion of the abdomen could be colitis or diverticulitis. °· You are passing blood in your stools (bright red or black tarry stools, with or without vomiting). °· You have blood in your urine. °· You develop chills, with or without a fever. °· You pass out. °MAKE SURE YOU:  °· Understand these instructions. °· Will watch your condition. °· Will get help right away if you are not doing well or get worse. °Document Released: 03/11/2007 Document Revised: 09/28/2013 Document Reviewed: 03/31/2009 °[employer]® Patient Information ©2015 [employer]. This information is not intended to replace advice given to you by your health care provider. Make sure you discuss any questions you have with your health care provider. ° °

## 2014-09-19 LAB — URINE CULTURE

## 2014-09-20 ENCOUNTER — Telehealth (HOSPITAL_BASED_OUTPATIENT_CLINIC_OR_DEPARTMENT_OTHER): Payer: Self-pay | Admitting: Emergency Medicine

## 2014-09-20 NOTE — Telephone Encounter (Signed)
Post ED Visit - Positive Culture Follow-up  Culture report reviewed by antimicrobial stewardship pharmacist: []  Wes Dulaney, Pharm.D., BCPS []  Heide Guile, Pharm.D., BCPS []  Alycia Rossetti, Pharm.D., BCPS []  [place], Pharm.D., BCPS, AAHIVP [x]  Legrand Como, Pharm.D., BCPS, AAHIVP []  Isac Sarna, Pharm.D., BCPS  Positive urine culture Staphylococcus Treated with none, asymptomatic no further patient follow-up is required at this time.  Hazle Nordmann 09/20/2014, 9:20 AM

## 2015-07-11 ENCOUNTER — Encounter (HOSPITAL_COMMUNITY): Payer: Self-pay | Admitting: Emergency Medicine

## 2015-07-11 ENCOUNTER — Emergency Department (HOSPITAL_COMMUNITY)
Admission: EM | Admit: 2015-07-11 | Discharge: 2015-07-11 | Disposition: A | Discharge status: Home / Self Care | Payer: Medicaid Other | Attending: Emergency Medicine | Admitting: Emergency Medicine

## 2015-07-11 DIAGNOSIS — R519 Headache, unspecified: Secondary | ICD-10-CM

## 2015-07-11 DIAGNOSIS — Z8679 Personal history of other diseases of the circulatory system: Secondary | ICD-10-CM | POA: Insufficient documentation

## 2015-07-11 DIAGNOSIS — R200 Anesthesia of skin: Secondary | ICD-10-CM | POA: Insufficient documentation

## 2015-07-11 DIAGNOSIS — H53149 Visual discomfort, unspecified: Secondary | ICD-10-CM | POA: Insufficient documentation

## 2015-07-11 DIAGNOSIS — R51 Headache: Secondary | ICD-10-CM | POA: Insufficient documentation

## 2015-07-11 DIAGNOSIS — Z86018 Personal history of other benign neoplasm: Secondary | ICD-10-CM | POA: Insufficient documentation

## 2015-07-11 LAB — BASIC METABOLIC PANEL
Anion gap: 7 (ref 5–15)
BUN: 14 mg/dL (ref 6–20)
CALCIUM: 9 mg/dL (ref 8.9–10.3)
CHLORIDE: 111 mmol/L (ref 101–111)
CO2: 24 mmol/L (ref 22–32)
CREATININE: 0.82 mg/dL (ref 0.44–1.00)
GFR calc Af Amer: >60 mL/min (ref >60)
GFR calc non Af Amer: >60 mL/min (ref >60)
GLUCOSE: 84 mg/dL (ref 65–99)
Potassium: 3.9 mmol/L (ref 3.5–5.1)
Sodium: 142 mmol/L (ref 135–145)

## 2015-07-11 LAB — CBC
HCT: 37.3 % (ref 36.0–46.0)
HEMOGLOBIN: 12.6 g/dL (ref 12.0–15.0)
MCH: 30.4 pg (ref 26.0–34.0)
MCHC: 33.8 g/dL (ref 30.0–36.0)
MCV: 90.1 fL (ref 78.0–100.0)
Platelets: 268 10*3/uL (ref 150–400)
RBC: 4.14 MIL/uL (ref 3.87–5.11)
RDW: 13.5 % (ref 11.5–15.5)
WBC: 6.6 10*3/uL (ref 4.0–10.5)

## 2015-07-11 MED ORDER — PROCHLORPERAZINE EDISYLATE 5 MG/ML IJ SOLN
10.0000 mg | Freq: Once | INTRAMUSCULAR | Status: AC
  Administered 2015-07-11: 10 mg via INTRAVENOUS
  Filled 2015-07-11: qty 2

## 2015-07-11 MED ORDER — DIPHENHYDRAMINE HCL 50 MG/ML IJ SOLN
25.0000 mg | Freq: Once | INTRAMUSCULAR | Status: AC
  Administered 2015-07-11: 25 mg via INTRAVENOUS
  Filled 2015-07-11: qty 1

## 2015-07-11 MED ORDER — KETOROLAC TROMETHAMINE 30 MG/ML IJ SOLN
30.0000 mg | Freq: Once | INTRAMUSCULAR | Status: AC
  Administered 2015-07-11: 30 mg via INTRAVENOUS
  Filled 2015-07-11: qty 1

## 2015-07-11 MED ORDER — SODIUM CHLORIDE 0.9 % IV BOLUS (SEPSIS)
1000.0000 mL | Freq: Once | INTRAVENOUS | Status: AC
  Administered 2015-07-11: 1000 mL via INTRAVENOUS

## 2015-07-11 NOTE — Discharge Instructions (Signed)
Continue taking your home medications as prescribed. I recommend taking Ibuprofen as prescribed over the counter as needed for your headache.  Follow-up with your primary care provider in 3 days. Please return to the Emergency Department if symptoms worsen or new onset of fever, neck stiffness, visual changes, light sensitivity, abdominal pain, vomiting, urinary symptoms, numbness, tingling, weakness, seizures, syncope.

## 2015-07-11 NOTE — ED Provider Notes (Signed)
CSN: [id number]     Arrival date & time 07/11/15  1105 History   First MD Initiated Contact with Patient 07/11/15 1700     Chief Complaint  Patient presents with  . Headache     (Consider location/radiation/quality/duration/timing/severity/associated sxs/prior Treatment) HPI   Patient is a 45 year old female with past medical history of migraines who presents the ED with complaint of headache, onset 4 days. Patient reports having a waxing and waning left-sided headache which she describes as a "tightness", denies any aggravating or alleviating factors. Endorses associated photophobia. Denies fever, chills, visual changes, lightheadedness, dizziness, neck stiffness, shortness of breath, chest pain, abdominal pain, nausea, vomiting, diarrhea, trouble speaking/slurred speech, weakness. Patient reports she has had similar migraines in the past. She also endorses having constant numbness to her left arm and leg that started shortly after the onset of her HA, denies any aggravating or alleviating factors. Denies any recent fall, trauma or injury. Pt reports taking Ibuprofen, Aleve and Goody's at home with mild intermittent relief.   Past Medical History  Diagnosis Date  . Fibroids   . Seasonal allergies   . [id number]    Past Surgical History  Procedure Laterality Date  . No past surgeries    . Abdominal hysterectomy  01/14/2012    Procedure: HYSTERECTOMY ABDOMINAL;  Surgeon: Avel Sensor, MD;  Location: [facility];  Service: Gynecology;  Laterality: N/A;  . Salpingoophorectomy  01/14/2012    Procedure: SALPINGO OOPHERECTOMY;  Surgeon: Avel Sensor, MD;  Location: [facility];  Service: Gynecology;  Laterality: Right;   No family history on file. Social History  Substance Use Topics  . Smoking status: Never Smoker   . Smokeless tobacco: Never Used  . Alcohol Use: No   OB History    No data available     Review of Systems  Eyes: Positive for photophobia.  Neurological: Positive  for numbness and headaches.  All other systems reviewed and are negative.     Allergies  Review of patient's allergies indicates no known allergies.  Home Medications   Prior to Admission medications   Medication Sig Start Date End Date Taking? Authorizing Provider  Aspirin-Salicylamide-Caffeine 123456 MG TABS Take 1 each by mouth as needed (pain).   Yes Historical Provider, MD  naproxen sodium (ANAPROX) 220 MG tablet Take 440 mg by mouth 2 (two) times daily as needed (migraine).   Yes Historical Provider, MD  HYDROcodone-acetaminophen (NORCO/VICODIN) 5-325 MG per tablet Take 1-2 tablets by mouth every 6 (six) hours as needed. Patient not taking: Reported on 07/11/2015 09/16/14   Montine Circle, PA-C   BP 155/87 mmHg  Pulse 85  Temp(Src) 98.2 F (36.8 C) (Oral)  Resp 17  SpO2 100% Physical Exam  Constitutional: She is oriented to person, place, and time. She appears well-developed and well-nourished. No distress.  HENT:  Head: Normocephalic and atraumatic.  Right Ear: Tympanic membrane normal.  Left Ear: Tympanic membrane normal.  Nose: Nose normal. Right sinus exhibits no maxillary sinus tenderness and no frontal sinus tenderness. Left sinus exhibits no maxillary sinus tenderness and no frontal sinus tenderness.  Mouth/Throat: Uvula is midline, oropharynx is clear and moist and mucous membranes are normal. No oropharyngeal exudate.  Eyes: Conjunctivae and EOM are normal. Pupils are equal, round, and reactive to light. Right eye exhibits no discharge. Left eye exhibits no discharge. No scleral icterus. Right eye exhibits no nystagmus. Left eye exhibits no nystagmus.  Neck: Normal range of motion. Neck supple.  Cardiovascular: Normal rate, regular rhythm,  normal heart sounds and intact distal pulses.   Pulmonary/Chest: Effort normal and breath sounds normal. No respiratory distress. She has no wheezes. She has no rales. She exhibits no tenderness.  Abdominal: Soft. Bowel sounds  are normal. She exhibits no distension and no mass. There is no tenderness. There is no rebound and no guarding.  Musculoskeletal: Normal range of motion. She exhibits no edema or tenderness.  Lymphadenopathy:    She has no cervical adenopathy.  Neurological: She is alert and oriented to person, place, and time. She has normal strength and normal reflexes. No cranial nerve deficit or sensory deficit. She displays a negative Romberg sign. Coordination and gait normal.  Skin: Skin is warm and dry. She is not diaphoretic.  Nursing note and vitals reviewed.   ED Course  Procedures (including critical care time) Labs Review Labs Reviewed  CBC  BASIC METABOLIC PANEL    Imaging Review No results found. I have personally reviewed and evaluated these images and lab results as part of my medical decision-making.  Filed Vitals:   07/11/15 1830 07/11/15 2028  BP: 152/90 155/87  Pulse: 65 85  Temp:    Resp:  17     MDM   Final diagnoses:  Nonintractable headache, unspecified chronicity pattern, unspecified headache type    Patient presents with headache which she notes is consistent with migraine she has had in the past. Denies fever, neck stiffness. VSS. Exam unremarkable, no neuro deficits, patient able to stand and ambulate without assistance, no ataxia noted. Patient given IV fluids and migraine cocktail ED.labs unremarkable. On reevaluation patient reports her headache has significantly improved. I do not suspect intracranial lesion, meningitis, encephalopathy or encephalitis and do not think that cranial imaging is needed at this time.  Plan to discharge patient home with symptomatic treatment. Advised patient to follow up with her PCP.  Evaluation does not show pathology requring ongoing emergent intervention or admission. Pt is hemodynamically stable and mentating appropriately. Discussed findings/results and plan with patient/guardian, who agrees with plan. All questions answered.  Return precautions discussed and outpatient follow up given.      Chesley Noon Nitro, [place] 07/11/15 2353  Davonna Belling, MD 07/12/15 0000

## 2015-07-11 NOTE — ED Notes (Signed)
Pt only allowed [facility] IV attempt.

## 2015-07-11 NOTE — ED Notes (Signed)
Pt states that she has had a lt sided headache with lt sided numbness since Friday.  States that she has had trouble sleeping due to the pain.  Pt was ambulatory with no assistance to triage room.  Pt also has NO slurred speech, no facial droop.

## 2015-12-07 ENCOUNTER — Emergency Department (HOSPITAL_COMMUNITY)
Admission: EM | Admit: 2015-12-07 | Discharge: 2015-12-08 | Disposition: A | Discharge status: Home / Self Care | Payer: Medicaid Other

## 2016-01-06 ENCOUNTER — Other Ambulatory Visit: Payer: Self-pay | Admitting: Physician Assistant

## 2016-01-06 DIAGNOSIS — N644 Mastodynia: Secondary | ICD-10-CM

## 2016-01-11 ENCOUNTER — Ambulatory Visit
Admission: RE | Admit: 2016-01-11 | Discharge: 2016-01-11 | Disposition: A | Discharge status: Home / Self Care | Payer: Medicaid Other | Source: Ambulatory Visit | Attending: Physician Assistant | Admitting: Physician Assistant

## 2016-01-11 DIAGNOSIS — N644 Mastodynia: Secondary | ICD-10-CM

## 2016-07-26 ENCOUNTER — Other Ambulatory Visit: Payer: Self-pay | Admitting: Family

## 2016-07-26 DIAGNOSIS — R1031 Right lower quadrant pain: Secondary | ICD-10-CM

## 2016-07-26 DIAGNOSIS — N83201 Unspecified ovarian cyst, right side: Secondary | ICD-10-CM

## 2016-07-30 ENCOUNTER — Ambulatory Visit
Admission: RE | Admit: 2016-07-30 | Discharge: 2016-07-30 | Disposition: A | Discharge status: Home / Self Care | Payer: Managed Care, Other (non HMO) | Source: Ambulatory Visit | Attending: Family | Admitting: Family

## 2016-07-30 DIAGNOSIS — R1031 Right lower quadrant pain: Secondary | ICD-10-CM

## 2016-07-30 DIAGNOSIS — N83201 Unspecified ovarian cyst, right side: Secondary | ICD-10-CM

## 2016-11-30 ENCOUNTER — Emergency Department (HOSPITAL_COMMUNITY)
Admission: EM | Admit: 2016-11-30 | Discharge: 2016-11-30 | Disposition: A | Discharge status: Home / Self Care | Payer: Self-pay | Attending: Emergency Medicine | Admitting: Emergency Medicine

## 2016-11-30 ENCOUNTER — Encounter (HOSPITAL_COMMUNITY): Payer: Self-pay

## 2016-11-30 ENCOUNTER — Emergency Department (HOSPITAL_COMMUNITY): Payer: Self-pay

## 2016-11-30 DIAGNOSIS — R0789 Other chest pain: Secondary | ICD-10-CM | POA: Insufficient documentation

## 2016-11-30 DIAGNOSIS — Z7982 Long term (current) use of aspirin: Secondary | ICD-10-CM | POA: Insufficient documentation

## 2016-11-30 DIAGNOSIS — Z79899 Other long term (current) drug therapy: Secondary | ICD-10-CM | POA: Insufficient documentation

## 2016-11-30 LAB — CBC WITH DIFFERENTIAL/PLATELET
BASOS ABS: 0 10*3/uL (ref 0.0–0.1)
BASOS PCT: 0 %
EOS PCT: 2 %
Eosinophils Absolute: 0.1 10*3/uL (ref 0.0–0.7)
HCT: 35.6 % — ABNORMAL LOW (ref 36.0–46.0)
Hemoglobin: 12 g/dL (ref 12.0–15.0)
Lymphocytes Relative: 56 %
Lymphs Abs: 3.7 10*3/uL (ref 0.7–4.0)
MCH: 30.1 pg (ref 26.0–34.0)
MCHC: 33.7 g/dL (ref 30.0–36.0)
MCV: 89.2 fL (ref 78.0–100.0)
MONOS PCT: 7 %
Monocytes Absolute: 0.4 10*3/uL (ref 0.1–1.0)
Neutro Abs: 2.4 10*3/uL (ref 1.7–7.7)
Neutrophils Relative %: 35 %
Platelets: 264 10*3/uL (ref 150–400)
RBC: 3.99 MIL/uL (ref 3.87–5.11)
RDW: 13.4 % (ref 11.5–15.5)
WBC: 6.7 10*3/uL (ref 4.0–10.5)

## 2016-11-30 LAB — BASIC METABOLIC PANEL
ANION GAP: 7 (ref 5–15)
BUN: 14 mg/dL (ref 6–20)
CALCIUM: 8.8 mg/dL — AB (ref 8.9–10.3)
CO2: 25 mmol/L (ref 22–32)
CREATININE: 0.83 mg/dL (ref 0.44–1.00)
Chloride: 107 mmol/L (ref 101–111)
Glucose, Bld: 102 mg/dL — ABNORMAL HIGH (ref 65–99)
Potassium: 3.8 mmol/L (ref 3.5–5.1)
Sodium: 139 mmol/L (ref 135–145)

## 2016-11-30 LAB — I-STAT TROPONIN, ED: TROPONIN I, POC: 0 ng/mL (ref 0.00–0.08)

## 2016-11-30 MED ORDER — KETOROLAC TROMETHAMINE 30 MG/ML IJ SOLN
60.0000 mg | Freq: Once | INTRAMUSCULAR | Status: AC
  Administered 2016-11-30: 60 mg via INTRAMUSCULAR
  Filled 2016-11-30: qty 2

## 2016-11-30 MED ORDER — KETOROLAC TROMETHAMINE 30 MG/ML IJ SOLN
30.0000 mg | Freq: Once | INTRAMUSCULAR | Status: DC
  Filled 2016-11-30: qty 1

## 2016-11-30 MED ORDER — LIDOCAINE 5 % EX PTCH: 1.0000 | MEDICATED_PATCH | CUTANEOUS | 0 refills | Status: DC

## 2016-11-30 MED ORDER — LIDOCAINE 5 % EX PTCH
1.0000 | MEDICATED_PATCH | CUTANEOUS | Status: DC
Start: 2016-11-30 — End: 2016-11-30
  Administered 2016-11-30: 1 via TRANSDERMAL
  Filled 2016-11-30: qty 1

## 2016-11-30 MED ORDER — NAPROXEN 500 MG PO TABS: 500.0000 mg | ORAL_TABLET | Freq: Two times a day (BID) | ORAL | 0 refills | Status: DC

## 2016-11-30 NOTE — ED Triage Notes (Signed)
Pt complains of chest pressure on the left side and left arm and hand numbness

## 2016-11-30 NOTE — ED Provider Notes (Signed)
[facility] DEPT Provider Note   CSN: [id number] Arrival date & time: 11/30/16  0306 By signing my name below, I, Dyke Brackett, attest that this documentation has been prepared under the direction and in the presence of non-physician practitioner, Antonietta Breach, PA-C. Electronically Signed: Dyke Brackett, Scribe. 11/30/2016. 3:41 AM.   History   Chief Complaint Chief Complaint  Patient presents with  . Chest Pain   HPI Yolanda Berry is a 46 y.o. female who presents to the Emergency Department complaining of persistent, gradually worsening, pressure-like chest pain onset four days ago. She describes this as right-sided chest pain that is worsened by moving and deep inspiration. No alleviating factors noted. She has taken Zantac and Gas-X with no relief of pain. She reports associated lightheadedness and tingling to her right arm and hand. Per pt, she has been seen for the same in the ED before, but is unsure what caused her symptoms. Pt reports that her mother recently had a stroke which has caused increased stress in her personal life. 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 No recent injury or trauma. No personal or family hx of MI. No recent surgeries or hospitalizations. She denies any SOB, diaphoresis, chills, leg swelling, fever, hemoptysis or syncope. Pt has no other acute complaints or associated symptoms at this time.   The history is provided by the patient. No language interpreter was used.   Past Medical History:  Diagnosis Date  . Fibroids   . Headache(784.0)   . Seasonal allergies     There are no active problems to display for this patient.   Past Surgical History:  Procedure Laterality Date  . ABDOMINAL HYSTERECTOMY  01/14/2012   Procedure: HYSTERECTOMY ABDOMINAL;  Surgeon: Avel Sensor, MD;  Location: [facility];  Service: Gynecology;  Laterality: N/A;  . NO PAST SURGERIES    . SALPINGOOPHORECTOMY  01/14/2012   Procedure: SALPINGO  OOPHERECTOMY;  Surgeon: Avel Sensor, MD;  Location: [facility];  Service: Gynecology;  Laterality: Right;    OB History    No data available       Home Medications    Prior to Admission medications   Medication Sig Start Date End Date Taking? Authorizing Provider  amLODipine (NORVASC) 10 MG tablet Take 10 mg by mouth daily.   Yes [provider]  aspirin EC 81 MG tablet Take 81 mg by mouth daily.   Yes [provider]  HYDROcodone-acetaminophen (NORCO/VICODIN) 5-325 MG per tablet Take 1-2 tablets by mouth every 6 (six) hours as needed. Patient not taking: Reported on 07/11/2015 09/16/14   Montine Circle, PA-C  lidocaine (LIDODERM) 5 % Place 1 patch onto the skin daily. Remove & Discard patch within 12 hours or as directed by MD 11/30/16   Antonietta Breach, PA-C  naproxen (NAPROSYN) 500 MG tablet Take 1 tablet (500 mg total) by mouth 2 (two) times daily. 11/30/16   Antonietta Breach, PA-C    Family History History reviewed. No pertinent family history.  Social History Social History  Substance Use Topics  . Smoking status: Never Smoker  . Smokeless tobacco: Never Used  . Alcohol use No     Allergies   Patient has no known allergies.   Review of Systems Review of Systems All systems reviewed and are negative for acute change except as noted in the HPI.   Physical Exam Updated Vital Signs BP (!) 145/95   Pulse 76   Temp 97.8 F (36.6 C)   Resp (!)  9   SpO2 100%   Physical Exam  Constitutional: She is oriented to person, place, and time. She appears well-developed and well-nourished. No distress.  Nontoxic and in NAD  HENT:  Head: Normocephalic and atraumatic.  Eyes: Conjunctivae and EOM are normal. No scleral icterus.  Neck: Normal range of motion.  No JVD  Cardiovascular: Normal rate, regular rhythm and intact distal pulses.   Pulmonary/Chest: Effort normal. No respiratory distress. She has no wheezes. She has no rales. She exhibits tenderness. She  exhibits no crepitus and no swelling.    Musculoskeletal: Normal range of motion.  Neurological: She is alert and oriented to person, place, and time. She exhibits normal muscle tone. Coordination normal.  GCS 15. Speech is goal oriented. Patient has equal grip strength bilaterally with 5/5 strength against resistance in all major muscle groups bilaterally. Sensation to light touch intact. Patient moves extremities without ataxia.  Skin: Skin is warm and dry. No rash noted. She is not diaphoretic. No erythema. No pallor.  Psychiatric: She has a normal mood and affect. Her behavior is normal.  Nursing note and vitals reviewed.   ED Treatments / Results  DIAGNOSTIC STUDIES:  Oxygen Saturation is 100% on RA, normal by my interpretation.    COORDINATION OF CARE:  3:40 AM Will order BMP, CBC, I-stat troponin and DG chest. Discussed treatment plan with pt at bedside and pt agreed to plan.   Labs (all labs ordered are listed, but only abnormal results are displayed) Labs Reviewed  CBC WITH DIFFERENTIAL/PLATELET - Abnormal; Notable for the following:       Result Value   HCT 35.6 (*)    All other components within normal limits  BASIC METABOLIC PANEL - Abnormal; Notable for the following:    Glucose, Bld 102 (*)    Calcium 8.8 (*)    All other components within normal limits  I-STAT TROPOININ, ED    EKG  EKG Interpretation  Date/Time:  Friday November 30 2016 03:12:23 EDT Ventricular Rate:  67 PR Interval:    QRS Duration: 92 QT Interval:  415 QTC Calculation: 439 R Axis:   50 Text Interpretation:  Sinus rhythm Normal ECG Confirmed by Veryl Speak [phone]) on 11/30/2016 4:54:24 AM       Radiology Dg Chest 2 View  Result Date: 11/30/2016 CLINICAL DATA:  Acute onset of left-sided chest pressure. Left arm and left hand numbness. Initial encounter. EXAM: CHEST  2 VIEW COMPARISON:  Chest radiograph performed 10/07/2013 FINDINGS: The lungs are well-aerated and clear. There is no  evidence of focal opacification, pleural effusion or pneumothorax. The heart is normal in size; the mediastinal contour is within normal limits. No acute osseous abnormalities are seen. IMPRESSION: No acute cardiopulmonary process seen. Electronically Signed   By: Garald Balding M.D.   On: 11/30/2016 04:06    Procedures Procedures (including critical care time)  Medications Ordered in ED Medications  lidocaine (LIDODERM) 5 % 1 patch (1 patch Transdermal Patch Applied 11/30/16 0507)  ketorolac (TORADOL) 30 MG/ML injection 60 mg (60 mg Intramuscular Given 11/30/16 0531)     Initial Impression / Assessment and Plan / ED Course  I have reviewed the triage vital signs and the nursing notes.  Pertinent labs & imaging results that were available during my care of the patient were reviewed by me and considered in my medical decision making (see chart for details).     46 year old female presents to the emergency department for evaluation of constant chest pain  which has been pressure-like and located to her right chest. She notes associated right-sided paresthesias without associated weakness. No shortness of breath or diaphoresis. Heart score c/w low risk of acute coronary event. Cardiac workup is reassuring. Pain is also reproducible on palpation suggesting musculoskeletal etiology. Doubt aortic dissection. Patient is PERC negative; further doubt PE.  On repeat assessment, patient notes improvement in her pain and paresthesias after application of a Lidoderm patch. Toradol also given. Symptoms consistent with chest wall strain and spasm, possibly secondary to occupational overuse. Primary care follow-up advised for recheck of symptoms. Return precautions given. Patient discharged in stable condition with no unaddressed concerns.   Final Clinical Impressions(s) / ED Diagnoses   Final diagnoses:  Chest wall pain    New Prescriptions New Prescriptions   LIDOCAINE (LIDODERM) 5 %    Place 1 patch  onto the skin daily. Remove & Discard patch within 12 hours or as directed by MD   NAPROXEN (NAPROSYN) 500 MG TABLET    Take 1 tablet (500 mg total) by mouth 2 (two) times daily.    I personally performed the services described in this documentation, which was scribed in my presence. The recorded information has been reviewed and is accurate.      Antonietta Breach, PA-C 11/30/16 3276    Veryl Speak, MD 11/30/16 [phone]

## 2017-01-01 ENCOUNTER — Encounter (HOSPITAL_COMMUNITY): Payer: Self-pay | Admitting: Emergency Medicine

## 2017-01-01 DIAGNOSIS — Z5321 Procedure and treatment not carried out due to patient leaving prior to being seen by health care provider: Secondary | ICD-10-CM | POA: Insufficient documentation

## 2017-01-01 DIAGNOSIS — R200 Anesthesia of skin: Secondary | ICD-10-CM | POA: Insufficient documentation

## 2017-01-01 LAB — CBC
HEMATOCRIT: 35.8 % — AB (ref 36.0–46.0)
HEMOGLOBIN: 11.9 g/dL — AB (ref 12.0–15.0)
MCH: 29.5 pg (ref 26.0–34.0)
MCHC: 33.2 g/dL (ref 30.0–36.0)
MCV: 88.8 fL (ref 78.0–100.0)
PLATELETS: 285 10*3/uL (ref 150–400)
RBC: 4.03 MIL/uL (ref 3.87–5.11)
RDW: 13.4 % (ref 11.5–15.5)
WBC: 6.7 10*3/uL (ref 4.0–10.5)

## 2017-01-01 LAB — BASIC METABOLIC PANEL
ANION GAP: 6 (ref 5–15)
BUN: 15 mg/dL (ref 6–20)
CHLORIDE: 107 mmol/L (ref 101–111)
CO2: 25 mmol/L (ref 22–32)
Calcium: 9.3 mg/dL (ref 8.9–10.3)
Creatinine, Ser: 0.87 mg/dL (ref 0.44–1.00)
GFR calc non Af Amer: >60 mL/min (ref >60)
Glucose, Bld: 105 mg/dL — ABNORMAL HIGH (ref 65–99)
POTASSIUM: 3.6 mmol/L (ref 3.5–5.1)
SODIUM: 138 mmol/L (ref 135–145)

## 2017-01-01 NOTE — ED Triage Notes (Signed)
Pt states for the last 2 week she has felt numbness and tingling in both feet, pt denies any weakness or pain. Pt has steady gait, no neuro deficits. other than HTN pt denies any other health problems.

## 2017-01-02 ENCOUNTER — Emergency Department (HOSPITAL_COMMUNITY)
Admission: EM | Admit: 2017-01-02 | Discharge: 2017-01-02 | Discharge status: Against Medical Advice | Payer: Managed Care, Other (non HMO) | Attending: Emergency Medicine | Admitting: Emergency Medicine

## 2017-01-02 NOTE — ED Notes (Signed)
Pt called for vitals recheck, no answer.  

## 2017-01-05 ENCOUNTER — Emergency Department (HOSPITAL_COMMUNITY)
Admission: EM | Admit: 2017-01-05 | Discharge: 2017-01-05 | Disposition: A | Discharge status: Home / Self Care | Payer: Managed Care, Other (non HMO) | Attending: Emergency Medicine | Admitting: Emergency Medicine

## 2017-01-05 ENCOUNTER — Encounter (HOSPITAL_COMMUNITY): Payer: Self-pay

## 2017-01-05 DIAGNOSIS — I10 Essential (primary) hypertension: Secondary | ICD-10-CM | POA: Insufficient documentation

## 2017-01-05 DIAGNOSIS — M79604 Pain in right leg: Secondary | ICD-10-CM | POA: Insufficient documentation

## 2017-01-05 DIAGNOSIS — M79605 Pain in left leg: Secondary | ICD-10-CM | POA: Insufficient documentation

## 2017-01-05 DIAGNOSIS — Z7982 Long term (current) use of aspirin: Secondary | ICD-10-CM | POA: Insufficient documentation

## 2017-01-05 HISTORY — DX: Essential (primary) hypertension: I10

## 2017-01-05 MED ORDER — NAPROXEN 500 MG PO TABS
500.0000 mg | ORAL_TABLET | Freq: Two times a day (BID) | ORAL | 0 refills | Status: DC
Start: 2017-01-05 — End: 2017-08-04

## 2017-01-05 NOTE — ED Provider Notes (Signed)
[facility] DEPT Provider Note   CSN: [id number] Arrival date & time: 01/05/17  1006  By signing my name below, I, Margit Banda, attest that this documentation has been prepared under the direction and in the presence of Kyleena Scheirer, PA-C. Electronically Signed: Margit Banda, ED Scribe. 01/05/17. 11:50 AM.  History   Chief Complaint Chief Complaint  Patient presents with  . Leg Pain    BILATERAL    HPI Yolanda Berry is a 46 y.o. female with a PMHx of fibroids, HA, HTN and seasonal allergies, who presents to the Emergency Department complaining of throbbing, tingling, bilateral leg pain for the last 3 weeks. Pain starts at the lateral hips, and radiates antero-laterally down both legs. Pain is worse at night, and relieved during the day while walking around. Lying on her sides exacerbates her pain. Pt has tried icy hot, bio freeze, and tylenol arthritis without relief. Wears steel toe boots and walks on concrete a lot for work for the last 6 months. She has not seen her PCP for her sx. Pt denies fever, chills, urinary sx, back pain, or any other complaints at this time. She denies trauma, falls, or injury.   PCP: Hinton Rao, MD  The history is provided by the patient. No language interpreter was used.    Past Medical History:  Diagnosis Date  . Fibroids   . Headache(784.0)   . Hypertension   . Seasonal allergies     There are no active problems to display for this patient.   Past Surgical History:  Procedure Laterality Date  . ABDOMINAL HYSTERECTOMY  01/14/2012   Procedure: HYSTERECTOMY ABDOMINAL;  Surgeon: Avel Sensor, MD;  Location: [facility];  Service: Gynecology;  Laterality: N/A;  . NO PAST SURGERIES    . SALPINGOOPHORECTOMY  01/14/2012   Procedure: SALPINGO OOPHERECTOMY;  Surgeon: Avel Sensor, MD;  Location: [facility];  Service: Gynecology;  Laterality: Right;    OB History    No data available       Home Medications    Prior to Admission  medications   Medication Sig Start Date End Date Taking? Authorizing Provider  amLODipine (NORVASC) 10 MG tablet Take 10 mg by mouth daily.    [provider]  aspirin EC 81 MG tablet Take 81 mg by mouth daily.    [provider]  HYDROcodone-acetaminophen (NORCO/VICODIN) 5-325 MG per tablet Take 1-2 tablets by mouth every 6 (six) hours as needed. Patient not taking: Reported on 07/11/2015 09/16/14   Montine Circle, PA-C  lidocaine (LIDODERM) 5 % Place 1 patch onto the skin daily. Remove & Discard patch within 12 hours or as directed by MD 11/30/16   Antonietta Breach, PA-C  naproxen (NAPROSYN) 500 MG tablet Take 1 tablet (500 mg total) by mouth 2 (two) times daily with a meal. 01/05/17   Sriman Tally, PA-C    Family History History reviewed. No pertinent family history.  Social History Social History  Substance Use Topics  . Smoking status: Never Smoker  . Smokeless tobacco: Never Used  . Alcohol use No     Allergies   Patient has no known allergies.   Review of Systems Review of Systems  Constitutional: Negative for chills and fever.  Genitourinary: Negative for dysuria, frequency, hematuria and urgency.  Musculoskeletal: Positive for arthralgias. Negative for back pain.  Skin: Negative for wound.     Physical Exam Updated Vital Signs BP 133/88 (BP Location: Left Arm)   Pulse 91   Temp 98.1 F (  36.7 C) (Oral)   Resp 16   Ht 5' (1.524 m)   Wt 66.2 kg (146 lb)   SpO2 98%   BMI 28.51 kg/m   Physical Exam  Constitutional: She is oriented to person, place, and time. She appears well-developed and well-nourished. No distress.  HENT:  Head: Normocephalic and atraumatic.  Eyes: EOM are normal.  Neck: Normal range of motion.  Cardiovascular: Normal rate, regular rhythm and intact distal pulses.   Pulmonary/Chest: Effort normal and breath sounds normal. No respiratory distress. She has no wheezes.  Abdominal: She exhibits no distension.    Musculoskeletal:       Right hip: She exhibits tenderness. She exhibits normal range of motion and normal strength.       Left hip: She exhibits tenderness. She exhibits normal range of motion and normal strength.       Right knee: She exhibits normal range of motion and no swelling.       Left knee: She exhibits normal range of motion and no swelling.       Cervical back: Normal.       Thoracic back: Normal.       Lumbar back: Normal.  Minimal TTP of lateral legs bilaterally from hip to knee. BLE strength intact, color and warmth equal , sensation intact, pedal pulses equal. Soft compartments. Pt is ambulatory and without pain when weight bearing. Full active ROM of back without pain.    Neurological: She is alert and oriented to person, place, and time.  Psychiatric: She has a normal mood and affect.  Nursing note and vitals reviewed.    ED Treatments / Results  DIAGNOSTIC STUDIES: Oxygen Saturation is 100% on RA, normal by my interpretation.  COORDINATION OF CARE: 11:50 AM-Discussed next steps with pt which includes taking an antiinflammatory twice a day with food and following up with her PCP. Pt verbalized understanding and is agreeable with the plan.   Labs (all labs ordered are listed, but only abnormal results are displayed) Labs Reviewed - No data to display  EKG  EKG Interpretation None       Radiology No results found.  Procedures Procedures (including critical care time)  Medications Ordered in ED Medications - No data to display   Initial Impression / Assessment and Plan / ED Course  I have reviewed the triage vital signs and the nursing notes.  Pertinent labs & imaging results that were available during my care of the patient were reviewed by me and considered in my medical decision making (see chart for details).     Pt with several weeks of bilaterally leg pain, worse at night after walking all day. Physical exam shows tenderness is mostly along  the TFL. No swelling or redness of posterior calf, doubt DVT. Pt is ambulatory with relief of pain, doubt bony hip abnormality. Likely muscular pain/soreness. Discussed findings with pt. Discussed conservative treatments. Pt to f/u with PCP. Return precautions given. Pt state she understands and agrees to plan.   Final Clinical Impressions(s) / ED Diagnoses   Final diagnoses:  Bilateral leg pain    New Prescriptions Discharge Medication List as of 01/05/2017 11:59 AM     I personally performed the services described in this documentation, which was scribed in my presence. The recorded information has been reviewed and is accurate.     Franchot Heidelberg, PA-C 01/06/17 0109    Pixie Casino, MD 01/06/17 0700

## 2017-01-05 NOTE — Discharge Instructions (Signed)
Take naproxen twice a day with meals. Do not take other anti-inflammatory is at the same time (Advil, Ibuprofen, Motrin, Aleve). You may continue to use Tylenol and topical pain relievers. You may gently try some of the hip stretches and exercises in this packet. Follow-up with your primary care doctor for further evaluation and management of your pain.  Return to the emergency room if you develop loss of bowel or bladder control, inability to walk, or any new or worsening symptoms.

## 2017-01-05 NOTE — ED Notes (Signed)
Bed: WTR7 Expected date:  Expected time:  Means of arrival:  Comments: 

## 2017-01-05 NOTE — ED Triage Notes (Signed)
PT C/O BILATERAL LEG DISCOMFORT, NUMBNESS, AND TINGLING X3 WEEKS. PT DENIES INJURY,A ND HAS BEEN USING OTC MEDICINES W/O RELIEF. DENIES URINARY SYMPTOMS.

## 2017-02-07 ENCOUNTER — Other Ambulatory Visit: Payer: Self-pay | Admitting: Family

## 2017-02-07 ENCOUNTER — Ambulatory Visit
Admission: RE | Admit: 2017-02-07 | Discharge: 2017-02-07 | Disposition: A | Discharge status: Home / Self Care | Payer: BLUE CROSS/BLUE SHIELD | Source: Ambulatory Visit | Attending: Family | Admitting: Family

## 2017-02-07 DIAGNOSIS — Z1239 Encounter for other screening for malignant neoplasm of breast: Secondary | ICD-10-CM

## 2017-03-12 ENCOUNTER — Ambulatory Visit
Admission: RE | Admit: 2017-03-12 | Discharge: 2017-03-12 | Disposition: A | Discharge status: Home / Self Care | Payer: BLUE CROSS/BLUE SHIELD | Source: Ambulatory Visit | Attending: Nurse Practitioner | Admitting: Nurse Practitioner

## 2017-03-12 ENCOUNTER — Other Ambulatory Visit: Payer: Self-pay | Admitting: Nurse Practitioner

## 2017-03-12 DIAGNOSIS — R52 Pain, unspecified: Secondary | ICD-10-CM

## 2017-08-04 ENCOUNTER — Other Ambulatory Visit: Payer: Self-pay

## 2017-08-04 ENCOUNTER — Encounter (HOSPITAL_COMMUNITY): Payer: Self-pay

## 2017-08-04 ENCOUNTER — Emergency Department (HOSPITAL_COMMUNITY): Payer: BLUE CROSS/BLUE SHIELD

## 2017-08-04 ENCOUNTER — Emergency Department (HOSPITAL_COMMUNITY)
Admission: EM | Admit: 2017-08-04 | Discharge: 2017-08-04 | Disposition: A | Discharge status: Home / Self Care | Payer: BLUE CROSS/BLUE SHIELD | Attending: Emergency Medicine | Admitting: Emergency Medicine

## 2017-08-04 DIAGNOSIS — K219 Gastro-esophageal reflux disease without esophagitis: Secondary | ICD-10-CM | POA: Insufficient documentation

## 2017-08-04 DIAGNOSIS — R1012 Left upper quadrant pain: Secondary | ICD-10-CM | POA: Diagnosis not present

## 2017-08-04 DIAGNOSIS — R1032 Left lower quadrant pain: Secondary | ICD-10-CM | POA: Diagnosis not present

## 2017-08-04 DIAGNOSIS — R110 Nausea: Secondary | ICD-10-CM | POA: Diagnosis not present

## 2017-08-04 DIAGNOSIS — I10 Essential (primary) hypertension: Secondary | ICD-10-CM | POA: Diagnosis not present

## 2017-08-04 DIAGNOSIS — K5900 Constipation, unspecified: Secondary | ICD-10-CM | POA: Diagnosis not present

## 2017-08-04 DIAGNOSIS — R1011 Right upper quadrant pain: Secondary | ICD-10-CM | POA: Diagnosis not present

## 2017-08-04 LAB — CBC
HEMATOCRIT: 41.4 % (ref 36.0–46.0)
HEMOGLOBIN: 13.8 g/dL (ref 12.0–15.0)
MCH: 30.6 pg (ref 26.0–34.0)
MCHC: 33.3 g/dL (ref 30.0–36.0)
MCV: 91.8 fL (ref 78.0–100.0)
Platelets: 313 10*3/uL (ref 150–400)
RBC: 4.51 MIL/uL (ref 3.87–5.11)
RDW: 13.3 % (ref 11.5–15.5)
WBC: 5.1 10*3/uL (ref 4.0–10.5)

## 2017-08-04 LAB — COMPREHENSIVE METABOLIC PANEL
ALT: 22 U/L (ref 14–54)
ANION GAP: 9 (ref 5–15)
AST: 20 U/L (ref 15–41)
Albumin: 4.6 g/dL (ref 3.5–5.0)
Alkaline Phosphatase: 52 U/L (ref 38–126)
BILIRUBIN TOTAL: 0.4 mg/dL (ref 0.3–1.2)
BUN: 9 mg/dL (ref 6–20)
CO2: 23 mmol/L (ref 22–32)
Calcium: 9.2 mg/dL (ref 8.9–10.3)
Chloride: 107 mmol/L (ref 101–111)
Creatinine, Ser: 0.8 mg/dL (ref 0.44–1.00)
GFR calc Af Amer: >60 mL/min (ref >60)
Glucose, Bld: 100 mg/dL — ABNORMAL HIGH (ref 65–99)
POTASSIUM: 4.1 mmol/L (ref 3.5–5.1)
Sodium: 139 mmol/L (ref 135–145)
TOTAL PROTEIN: 7.7 g/dL (ref 6.5–8.1)

## 2017-08-04 LAB — URINALYSIS, ROUTINE W REFLEX MICROSCOPIC
Bilirubin Urine: NEGATIVE
Glucose, UA: NEGATIVE mg/dL
Hgb urine dipstick: NEGATIVE
KETONES UR: NEGATIVE mg/dL
Leukocytes, UA: NEGATIVE
NITRITE: NEGATIVE
PH: 9 — AB (ref 5.0–8.0)
Protein, ur: NEGATIVE mg/dL
SPECIFIC GRAVITY, URINE: 1.009 (ref 1.005–1.030)

## 2017-08-04 LAB — I-STAT BETA HCG BLOOD, ED (MC, WL, AP ONLY): I-stat hCG, quantitative: <5 m[IU]/mL (ref <5)

## 2017-08-04 LAB — LIPASE, BLOOD: Lipase: 36 U/L (ref 11–51)

## 2017-08-04 MED ORDER — OMEPRAZOLE 20 MG PO CPDR: 20.0000 mg | DELAYED_RELEASE_CAPSULE | Freq: Every day | ORAL | 0 refills | Status: DC

## 2017-08-04 MED ORDER — ONDANSETRON 4 MG PO TBDP: 4.0000 mg | ORAL_TABLET | Freq: Three times a day (TID) | ORAL | 0 refills | Status: DC | PRN

## 2017-08-04 MED ORDER — GI COCKTAIL ~~LOC~~
30.0000 mL | Freq: Once | ORAL | Status: AC
  Administered 2017-08-04: 30 mL via ORAL
  Filled 2017-08-04: qty 30

## 2017-08-04 MED ORDER — ACETAMINOPHEN 325 MG PO TABS
650.0000 mg | ORAL_TABLET | Freq: Once | ORAL | Status: AC
  Administered 2017-08-04: 650 mg via ORAL
  Filled 2017-08-04: qty 2

## 2017-08-04 NOTE — ED Provider Notes (Signed)
[facility] DEPT Provider Note   CSN: [id number] Arrival date & time: 08/04/17  0101     History   Chief Complaint Chief Complaint  Patient presents with  . Abdominal Pain    HPI Yolanda Berry is a 47 y.o. female with a history of HTN who presents to the emergency department with a chief complaint of abdominal pain with associated malodorous urine, constipation, and nausea.    She endorses constant epigastric pain that radiates up into the chest, characterized as burning, that began 6 days ago. She initially thought the pain was a 24-hour virus. She reports 5 days ago she developed increased bloating and flatus so she treated the sypmptoms with Gas-X, vinegar, and 2 doses of a mixtures consisting of a teaspoon of baking soda with lemon juice and water, which did not improve her symptoms.  She also endorses intermittent nausea, constipation, and malodorous urine that she noted this morning.  She denies fever, chills, dysuria, hematuria, vaginal pain or discharge, back pain, chest pain, dyspnea, diarrhea, melena, hematochezia.  Surgical history includes hysterectomy with right nephrectomy.  The history is provided by the patient. No language interpreter was used.    Past Medical History:  Diagnosis Date  . Fibroids   . Headache(784.0)   . Hypertension   . Seasonal allergies     There are no active problems to display for this patient.   Past Surgical History:  Procedure Laterality Date  . ABDOMINAL HYSTERECTOMY  01/14/2012   Procedure: HYSTERECTOMY ABDOMINAL;  Surgeon: Avel Sensor, MD;  Location: [facility];  Service: Gynecology;  Laterality: N/A;  . NO PAST SURGERIES    . SALPINGOOPHORECTOMY  01/14/2012   Procedure: SALPINGO OOPHERECTOMY;  Surgeon: Avel Sensor, MD;  Location: [facility];  Service: Gynecology;  Laterality: Right;    OB History    No data available       Home Medications    Prior to Admission medications   Medication  Sig Start Date End Date Taking? Authorizing Provider  amLODipine (NORVASC) 10 MG tablet Take 10 mg by mouth daily.   Yes [provider]  Sennosides (EX-LAX) 15 MG TABS Take 2 tablets by mouth once as needed (abdominal pain).   Yes [provider]  simethicone (MYLICON) 80 MG chewable tablet Chew 80 mg by mouth every 6 (six) hours as needed for flatulence.   Yes [provider]  omeprazole (PRILOSEC) 20 MG capsule Take 1 capsule (20 mg total) by mouth daily. 08/04/17   McDonald, Mia A, PA-C  ondansetron (ZOFRAN ODT) 4 MG disintegrating tablet Take 1 tablet (4 mg total) by mouth every 8 (eight) hours as needed for nausea or vomiting. 08/04/17   McDonald, Mia A, PA-C    Family History Family History  Problem Relation Age of Onset  . Breast cancer Neg Hx     Social History Social History   Tobacco Use  . Smoking status: Never Smoker  . Smokeless tobacco: Never Used  Substance Use Topics  . Alcohol use: No  . Drug use: No     Allergies   Patient has no known allergies.   Review of Systems Review of Systems  Constitutional: Negative for chills and fever.  HENT: Negative for congestion.   Eyes: Negative for visual disturbance.  Respiratory: Negative for shortness of breath.   Cardiovascular: Negative for chest pain.  Gastrointestinal: Positive for abdominal distention (resolved), abdominal pain, constipation and nausea. Negative for anal bleeding, blood in stool, diarrhea and  vomiting.  Genitourinary: Negative for dysuria, frequency, vaginal bleeding, vaginal discharge and vaginal pain.       Malodorous urine  Musculoskeletal: Negative for back pain and neck pain.  Skin: Negative for rash.  Allergic/Immunologic: Negative for immunocompromised state.  Neurological: Negative for weakness and numbness.  Hematological: Does not bruise/bleed easily.  Psychiatric/Behavioral: Negative for confusion.     Physical Exam Updated Vital Signs BP 117/89 (BP  Location: Right Arm)   Pulse 77   Temp 98.6 F (37 C) (Oral)   Resp 16   Ht 5\' 4"  (1.626 m)   Wt 66.2 kg (146 lb)   SpO2 100%   BMI 25.06 kg/m   Physical Exam  Constitutional:  Non-toxic appearance. She does not appear ill. No distress.  HENT:  Head: Normocephalic.  Eyes: Conjunctivae are normal. No scleral icterus.  Neck: Normal range of motion. Neck supple. No JVD present. No tracheal deviation present.  Cardiovascular: Normal rate, regular rhythm, normal heart sounds and intact distal pulses. Exam reveals no gallop and no friction rub.  No murmur heard. Pulmonary/Chest: Effort normal and breath sounds normal. No stridor. No respiratory distress. She has no wheezes. She has no rales. She exhibits no tenderness.  Abdominal: Soft. She exhibits no distension and no mass. There is tenderness. There is no rebound and no guarding. No hernia.  Moderate TTP to the RUQ, LUQ, and LLQ. Hyperactive BS. No CVA tenderness bilaterally. No peritoneal signs.   Musculoskeletal: Normal range of motion. She exhibits no edema, tenderness or deformity.  Lymphadenopathy:    She has no cervical adenopathy.  Neurological: She is alert.  Skin: Skin is warm. Capillary refill takes less than 2 seconds. No rash noted.  Psychiatric: Her behavior is normal.  Nursing note and vitals reviewed.    ED Treatments / Results  Labs (all labs ordered are listed, but only abnormal results are displayed) Labs Reviewed  COMPREHENSIVE METABOLIC PANEL - Abnormal; Notable for the following components:      Result Value   Glucose, Bld 100 (*)    All other components within normal limits  URINALYSIS, ROUTINE W REFLEX MICROSCOPIC - Abnormal; Notable for the following components:   Color, Urine STRAW (*)    pH 9.0 (*)    All other components within normal limits  LIPASE, BLOOD  CBC  I-STAT BETA HCG BLOOD, ED ([facility], [facility], [facility] ONLY)    EKG  EKG Interpretation None       Radiology US Transvaginal Non-ob  Result  Date: 08/04/2017 CLINICAL DATA:  Pelvic pain. EXAM: TRANSABDOMINAL AND TRANSVAGINAL ULTRASOUND OF PELVIS TECHNIQUE: Both transabdominal and transvaginal ultrasound examinations of the pelvis were performed. Transabdominal technique was performed for global imaging of the pelvis including uterus, ovaries, adnexal regions, and pelvic cul-de-sac. It was necessary to proceed with endovaginal exam following the transabdominal exam to visualize the pelvic structures. COMPARISON:  07/30/2016 FINDINGS: Uterus Measurements: Prior hysterectomy. Endometrium Thickness: N/A. Right ovary Measurements: prior oophorectomy.  No adnexal mass. Left ovary Measurements: 3.3 x 1.8 x 1.6 cm. Normal appearance/no adnexal mass. Other findings Trace free fluid in the pelvis. IMPRESSION: Prior hysterectomy and right oophorectomy. No acute findings. Electronically Signed   By: Rolm Baptise M.D.   On: 08/04/2017 10:09   US Pelvis Complete  Result Date: 08/04/2017 CLINICAL DATA:  Pelvic pain. EXAM: TRANSABDOMINAL AND TRANSVAGINAL ULTRASOUND OF PELVIS TECHNIQUE: Both transabdominal and transvaginal ultrasound examinations of the pelvis were performed. Transabdominal technique was performed for global imaging of the pelvis including uterus, ovaries,  adnexal regions, and pelvic cul-de-sac. It was necessary to proceed with endovaginal exam following the transabdominal exam to visualize the pelvic structures. COMPARISON:  07/30/2016 FINDINGS: Uterus Measurements: Prior hysterectomy. Endometrium Thickness: N/A. Right ovary Measurements: prior oophorectomy.  No adnexal mass. Left ovary Measurements: 3.3 x 1.8 x 1.6 cm. Normal appearance/no adnexal mass. Other findings Trace free fluid in the pelvis. IMPRESSION: Prior hysterectomy and right oophorectomy. No acute findings. Electronically Signed   By: Rolm Baptise M.D.   On: 08/04/2017 10:09   Dg Abd Acute W/chest  Result Date: 08/04/2017 CLINICAL DATA:  Constipation EXAM: DG ABDOMEN ACUTE W/ 1V  CHEST COMPARISON:  11/30/2016 FINDINGS: Moderate stool burden throughout the colon. The bowel gas pattern is normal. There is no evidence of free intraperitoneal air. No suspicious radio-opaque calculi or other significant radiographic abnormality is seen. Heart size and mediastinal contours are within normal limits. Both lungs are clear. IMPRESSION: Moderate stool burden.  No acute findings. Electronically Signed   By: Rolm Baptise M.D.   On: 08/04/2017 08:38    Procedures Procedures (including critical care time)  Medications Ordered in ED Medications  gi cocktail (Maalox,Lidocaine,Donnatal) (30 mLs Oral Given 08/04/17 0658)  acetaminophen (TYLENOL) tablet 650 mg (650 mg Oral Given 08/04/17 4696)     Initial Impression / Assessment and Plan / ED Course  I have reviewed the triage vital signs and the nursing notes.  Pertinent labs & imaging results that were available during my care of the patient were reviewed by me and considered in my medical decision making (see chart for details).  Clinical Course as of Aug 05 1451  Sun Aug 04, 2017  0758 Recent recheck.  She reports the burning pain that radiated up into the chest has now resolved, but is still endorsing sharp periumbilical and left-sided pain.  After reviewing the patient's chart, pelvic ultrasound last year with a complex left-sided ovarian cyst that has not been re-evaluated for the last year.  [MM]    Clinical Course User Index [MM] McDonald, Mia A, PA-C    47 year old female with a history of HTN resenting with abdominal pain, nausea, constipation, and malodorous urine. UA with ph of 9.0, likely secondary to the patient attempting to treat her abdominal pain by consuming baking soda.  Labs are unremarkable and otherwise reassuring.  Patient reports improvement of burning pain with GI cocktail, but still endorses discomfort throughout the abdomen.  After reviewing the patient's medical record, she was evaluated in 03/18 and pelvic  US demonstrated a 1.9 cm complex left ovarian lesion that has not been reevaluated in the last year. Given new constipation and h/o of complex cyst, X-ray of the abdomen w/ chest and pelvic US re-ordered to ensure the patient did not have constipation secondary to an enlarging ovarian mass.  X-ray with moderate stool burden.  Ultrasound with resolution of left ovarian cyst.  Discussed these findings with the patient.  Will discharge the patient to home with Zofran, omeprazole, and bowel regimen for constipation.  Recommended reevaluation with PCP if no improvement of her symptoms in the next 3-4 days.  Strict return precautions given.  VSS. NAD. The patient is safe for d/c to home at this time.   Final Clinical Impressions(s) / ED Diagnoses   Final diagnoses:  Constipation, unspecified constipation type  Nausea  Gastroesophageal reflux disease, esophagitis presence not specified    ED Discharge Orders        Ordered    ondansetron (ZOFRAN ODT) 4 MG disintegrating  tablet  Every 8 hours PRN     08/04/17 1050    omeprazole (PRILOSEC) 20 MG capsule  Daily     08/04/17 [place], Laymond Purser, PA-C 08/04/17 1453    Lacretia Leigh, MD 08/05/17 [phone]

## 2017-08-04 NOTE — Discharge Instructions (Signed)
Let 1 tablet of Zofran dissolve under tongue every 8 hours as needed for nausea.  For the burning pain in your chest, take 1 tablet of omeprazole daily for the next 2 weeks.  For constipation, fill a glass half full of prune juice and and half full of grape juice and heat it up for 30 seconds in the microwave. If this does not improve your symptoms, take 2 capfuls of Miralax by mouth mixed with 16 ounces of juice or gatorade once. -If you still do not have a bowel movement, take 1 capful of Miralax by mouth daily mixed with 16 ounces of juice or gatrorade -If you experience diarrhea, you may decrease the daily dose by 1/2 capful as needed.  Take 650 mg of Tylenol every 6 hours or 600 mg of ibuprofen with food every 6 hours for pain control.  If your symptoms do not improve within the next 3-4 days, please call and schedule follow-up appointment with your primary care provider.  Abdominal (belly) pain can be caused by many things. Your caregiver performed an examination and possibly ordered blood/urine tests and imaging (CT scan, x-rays, ultrasound). Many cases can be observed and treated at home after initial evaluation in the emergency department. Even though you are being discharged home, abdominal pain can be unpredictable. Therefore, you need a repeated exam if your pain does not resolve, returns, or worsens. Most patients with abdominal pain don't have to be admitted to the hospital or have surgery, but serious problems like appendicitis and gallbladder attacks can start out as nonspecific pain. Many abdominal conditions cannot be diagnosed in one visit, so follow-up evaluations are very important. SEEK IMMEDIATE MEDICAL ATTENTION IF: The pain does not go away or becomes severe.  A temperature above 101 develops.  Repeated vomiting occurs (multiple episodes).  The pain becomes localized to portions of the abdomen. The right side could possibly be appendicitis. In an adult, the left lower portion  of the abdomen could be colitis or diverticulitis.  Blood is being passed in stools or vomit (bright red or black tarry stools).  Return also if you develop chest pain, difficulty breathing, dizziness or fainting, or become confused, poorly responsive, or inconsolable (young children).

## 2017-08-04 NOTE — ED Triage Notes (Signed)
States since Monday abdominal pain and burning in chest states not getting any better with nausea.

## 2017-09-06 IMAGING — US US TRANSVAGINAL NON-OB
1 series · 13 of 25 positions shown · non-contrast
Comparison: CT abdomen and pelvis 09/15/2014

CLINICAL DATA: Right lower quadrant pain for 4 weeks. History of
hysterectomy and unilateral oophorectomy. History of left ovarian
cyst.

EXAM:
TRANSABDOMINAL AND TRANSVAGINAL ULTRASOUND OF PELVIS
TECHNIQUE: Both transabdominal and transvaginal ultrasound examinations of the
pelvis were performed. Transabdominal technique was performed for
global imaging of the pelvis including uterus, ovaries, adnexal
regions, and pelvic cul-de-sac. It was necessary to proceed with
endovaginal exam following the transabdominal exam to visualize the
ovaries/ adnexa.

[Series 1: us transvaginal non-ob · 0.22mm/px · 13 of 31 slices shown]
[im 1/31]
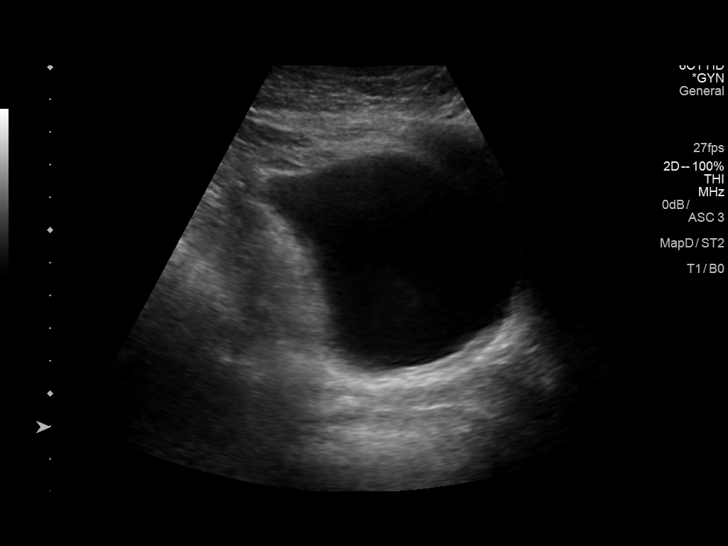
[im 3/31]
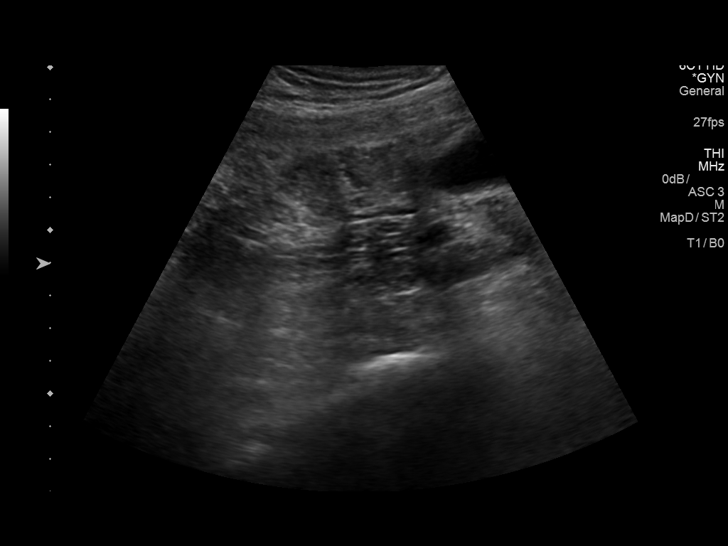
[im 6/31]
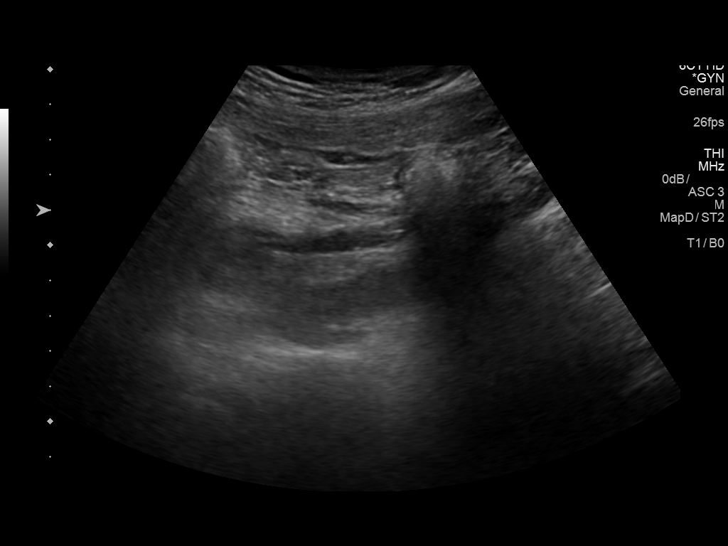
[im 8/31]
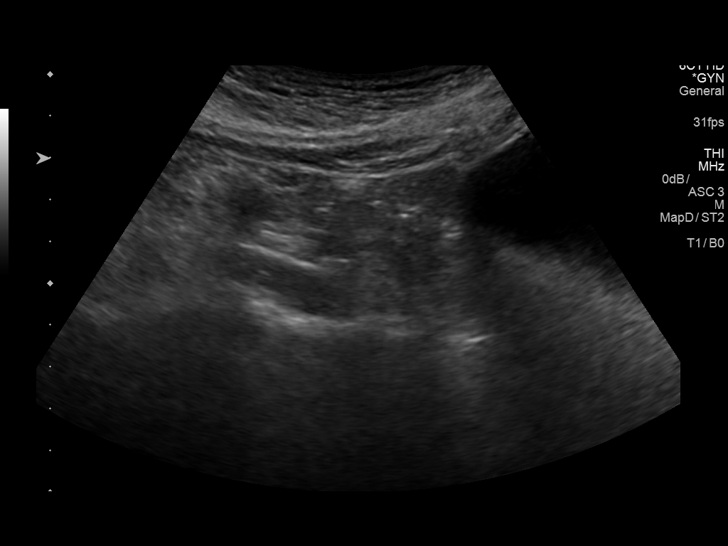
[im 11/31]
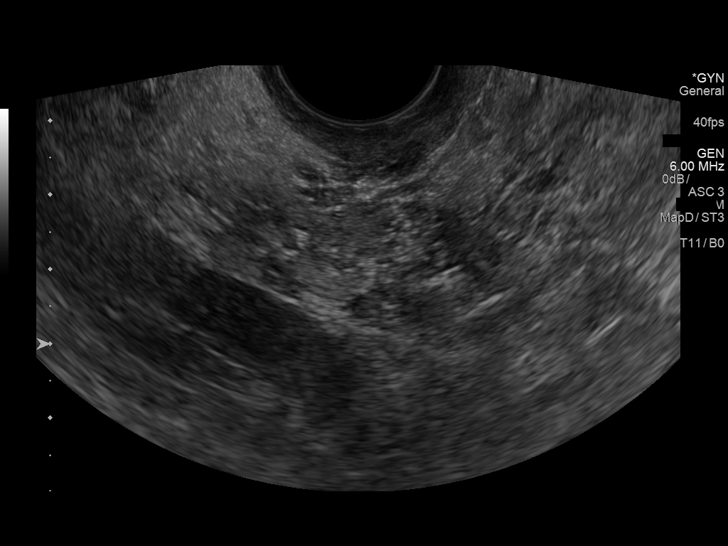
[im 13/31]
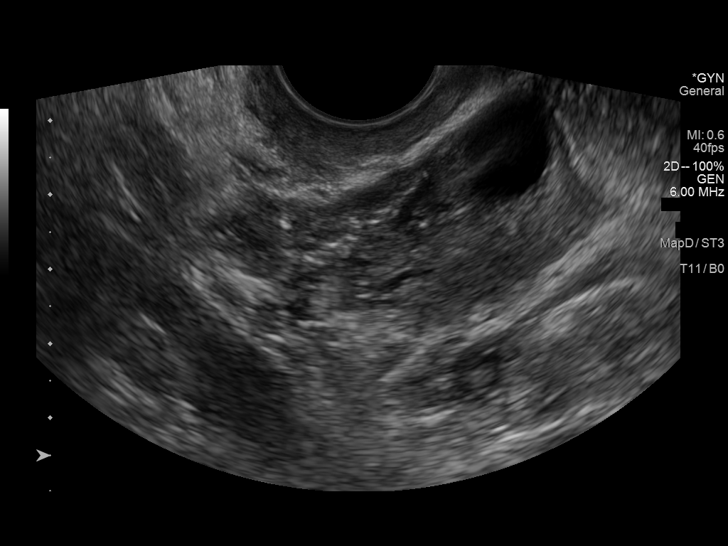
[im 16/31]
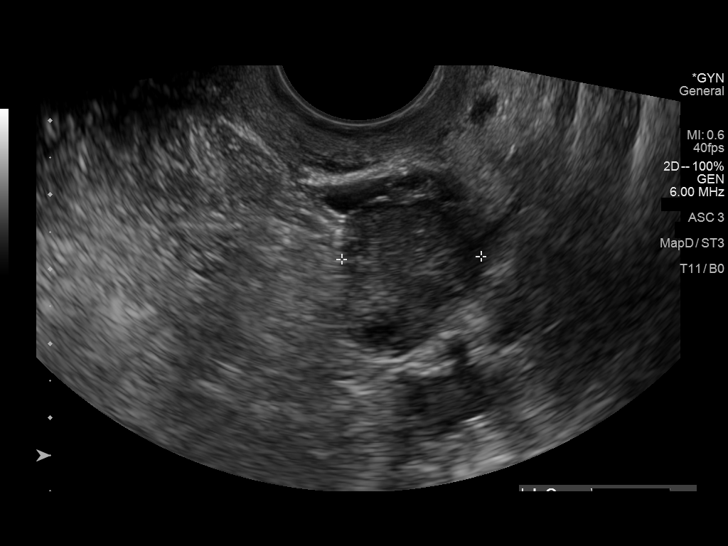
[im 18/31]
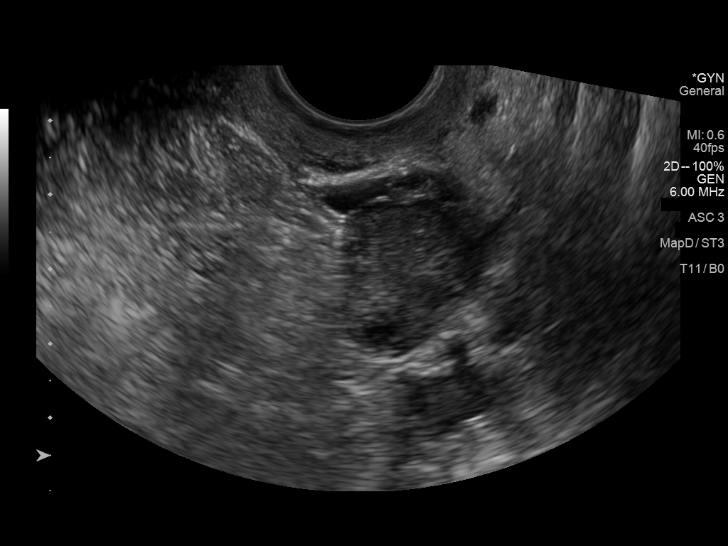
[im 21/31]
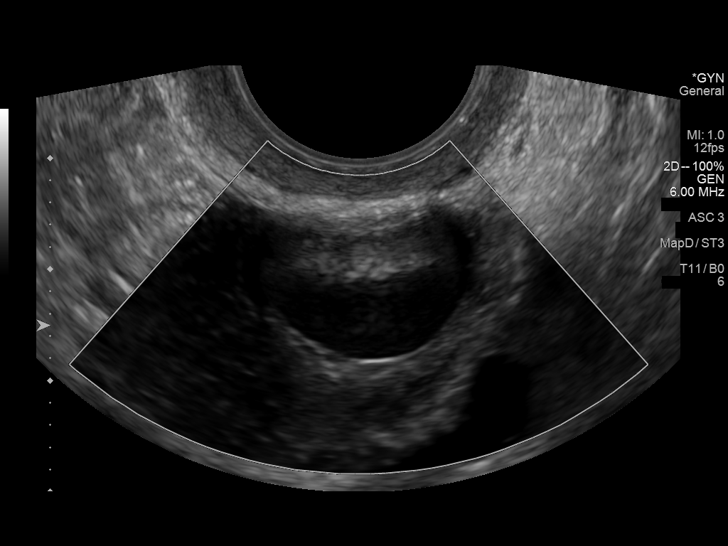
[im 23/31]
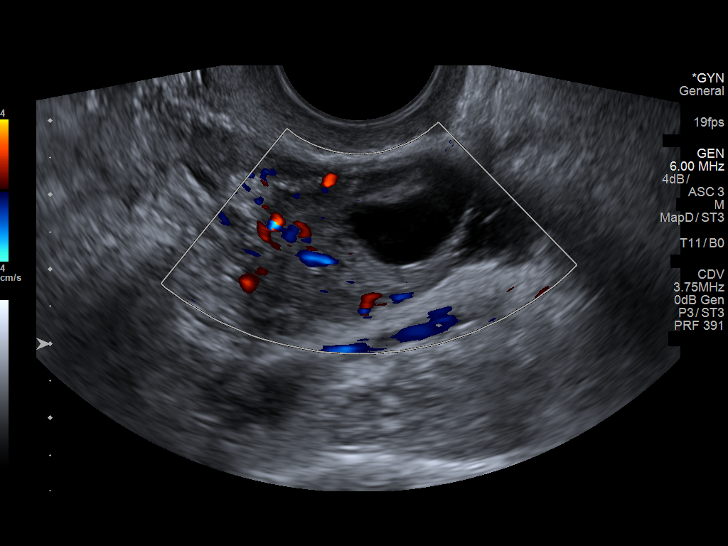
[im 26/31]
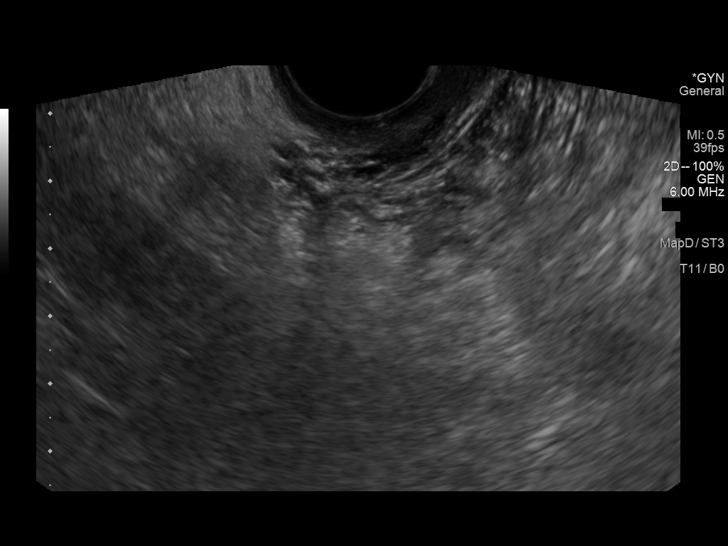
[im 28/31]
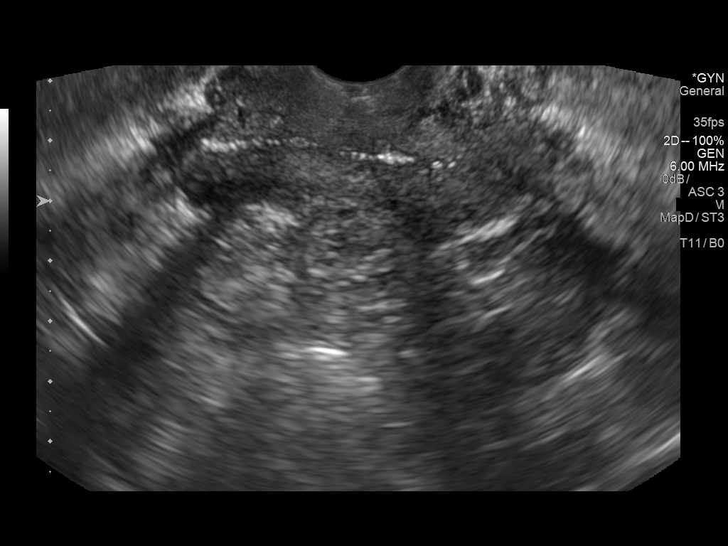
[im 31/31]
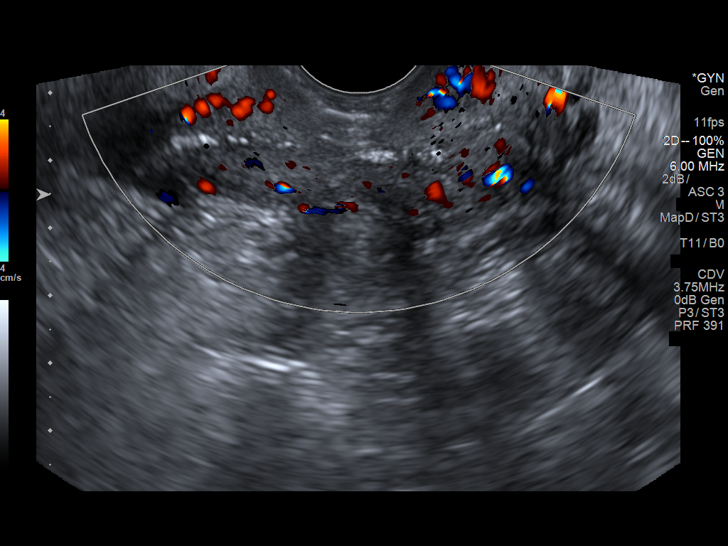

[13 of 25 positions shown; findings below may reference images not displayed]

FINDINGS: Uterus

Status post hysterectomy. Slight prominence of the vaginal cuff
without a focal abnormality identified.

Right ovary

Not visualized and presumably surgically absent.

Left ovary

Measurements: 3.5 x 2.2 x 1.9 cm. 1.9 x 1.5 x 1.6 cm complex cyst
containing a small to moderate amount of solid appearing material
along the wall with partially concave margins and no evidence of
internal vascularity on color Doppler imaging.

Other findings

Trace free fluid in the left adnexa.
IMPRESSION: 1. 1.9 cm complex left ovarian lesion, likely a hemorrhagic cyst
with retracting clot.
2. Trace free fluid.
3. Prior hysterectomy and right oophorectomy.

## 2018-03-20 ENCOUNTER — Ambulatory Visit
Admission: RE | Admit: 2018-03-20 | Discharge: 2018-03-20 | Disposition: A | Discharge status: Home / Self Care | Payer: BLUE CROSS/BLUE SHIELD | Source: Ambulatory Visit | Attending: Nurse Practitioner | Admitting: Nurse Practitioner

## 2018-03-20 ENCOUNTER — Other Ambulatory Visit: Payer: Self-pay | Admitting: Nurse Practitioner

## 2018-03-20 DIAGNOSIS — Z1231 Encounter for screening mammogram for malignant neoplasm of breast: Secondary | ICD-10-CM

## 2018-08-26 ENCOUNTER — Emergency Department (HOSPITAL_COMMUNITY)
Admission: EM | Admit: 2018-08-26 | Discharge: 2018-08-26 | Disposition: A | Discharge status: Home / Self Care | Payer: BLUE CROSS/BLUE SHIELD | Attending: Emergency Medicine | Admitting: Emergency Medicine

## 2018-08-26 ENCOUNTER — Emergency Department (HOSPITAL_COMMUNITY): Payer: BLUE CROSS/BLUE SHIELD

## 2018-08-26 ENCOUNTER — Other Ambulatory Visit: Payer: Self-pay

## 2018-08-26 ENCOUNTER — Encounter (HOSPITAL_COMMUNITY): Payer: Self-pay | Admitting: Emergency Medicine

## 2018-08-26 DIAGNOSIS — I10 Essential (primary) hypertension: Secondary | ICD-10-CM | POA: Insufficient documentation

## 2018-08-26 DIAGNOSIS — R079 Chest pain, unspecified: Secondary | ICD-10-CM | POA: Diagnosis not present

## 2018-08-26 DIAGNOSIS — R202 Paresthesia of skin: Secondary | ICD-10-CM | POA: Diagnosis not present

## 2018-08-26 DIAGNOSIS — R200 Anesthesia of skin: Secondary | ICD-10-CM | POA: Insufficient documentation

## 2018-08-26 DIAGNOSIS — R0789 Other chest pain: Secondary | ICD-10-CM | POA: Diagnosis present

## 2018-08-26 LAB — BASIC METABOLIC PANEL
Anion gap: 9 (ref 5–15)
BUN: 13 mg/dL (ref 6–20)
CO2: 25 mmol/L (ref 22–32)
Calcium: 9.2 mg/dL (ref 8.9–10.3)
Chloride: 106 mmol/L (ref 98–111)
Creatinine, Ser: 0.82 mg/dL (ref 0.44–1.00)
GFR calc Af Amer: >60 mL/min (ref >60)
GFR calc non Af Amer: >60 mL/min (ref >60)
Glucose, Bld: 87 mg/dL (ref 70–99)
Potassium: 3.4 mmol/L — ABNORMAL LOW (ref 3.5–5.1)
Sodium: 140 mmol/L (ref 135–145)

## 2018-08-26 LAB — I-STAT BETA HCG BLOOD, ED (MC, WL, AP ONLY): I-stat hCG, quantitative: <5 m[IU]/mL (ref <5)

## 2018-08-26 LAB — CBC
HCT: 39 % (ref 36.0–46.0)
Hemoglobin: 12.5 g/dL (ref 12.0–15.0)
MCH: 30 pg (ref 26.0–34.0)
MCHC: 32.1 g/dL (ref 30.0–36.0)
MCV: 93.5 fL (ref 80.0–100.0)
NRBC: 0 % (ref 0.0–0.2)
PLATELETS: 296 10*3/uL (ref 150–400)
RBC: 4.17 MIL/uL (ref 3.87–5.11)
RDW: 13.5 % (ref 11.5–15.5)
WBC: 7.7 10*3/uL (ref 4.0–10.5)

## 2018-08-26 LAB — TROPONIN I
Troponin I: <0.03 ng/mL (ref <0.03)
Troponin I: <0.03 ng/mL (ref <0.03)

## 2018-08-26 LAB — I-STAT TROPONIN, ED: Troponin i, poc: 0 ng/mL (ref 0.00–0.08)

## 2018-08-26 MED ORDER — ASPIRIN 81 MG PO CHEW
324.0000 mg | CHEWABLE_TABLET | Freq: Once | ORAL | Status: AC
  Administered 2018-08-26: 324 mg via ORAL
  Filled 2018-08-26: qty 4

## 2018-08-26 MED ORDER — NITROGLYCERIN 0.4 MG SL SUBL
0.4000 mg | SUBLINGUAL_TABLET | SUBLINGUAL | Status: DC | PRN
  Administered 2018-08-26: 0.4 mg via SUBLINGUAL
  Filled 2018-08-26: qty 1

## 2018-08-26 MED ORDER — SODIUM CHLORIDE 0.9% FLUSH
3.0000 mL | Freq: Once | INTRAVENOUS | Status: AC
  Administered 2018-08-26: 3 mL via INTRAVENOUS

## 2018-08-26 NOTE — ED Provider Notes (Signed)
[facility] DEPT Provider Note   CSN: [id number] Arrival date & time: 08/26/18  1454    History   Chief Complaint Chief Complaint  Patient presents with  . Chest Pain    HPI Yolanda Berry is a 48 y.o. female.  HPI: A 48 year old patient with a history of hypertension presents for evaluation of chest pain. Initial onset of pain was approximately 1-3 hours ago. The patient's chest pain is described as heaviness/pressure/tightness and is not worse with exertion. The patient's chest pain is middle- or left-sided, is not well-localized, is not sharp and does not radiate to the arms/jaw/neck. The patient does not complain of nausea and denies diaphoresis. The patient has no history of stroke, has no history of peripheral artery disease, has not smoked in the past 90 days, denies any history of treated diabetes, has no relevant family history of coronary artery disease (first degree relative at less than age 68), has no history of hypercholesterolemia and does not have an elevated BMI (>=30).   Patient reports that her symptoms started about 2 hours ago.  Symptoms have been constant.  She also is having tingling sensation in her right forearm.  She also has right shoulder discomfort but denies any neck pain and there is no history of radiculopathy or shoulder injuries.  Patient's family history is negative for any CAD or PE or stroke in young people.  Pt has no hx of PE, DVT and denies any exogenous hormone (testosterone / estrogen) use, long distance travels or surgery in the past 6 weeks, active cancer, recent immobilization.  She also denies any smoking or substance abuse.  HPI  Past Medical History:  Diagnosis Date  . Fibroids   . Headache(784.0)   . Hypertension   . Seasonal allergies     There are no active problems to display for this patient.   Past Surgical History:  Procedure Laterality Date  . ABDOMINAL HYSTERECTOMY  01/14/2012   Procedure:  HYSTERECTOMY ABDOMINAL;  Surgeon: Avel Sensor, MD;  Location: [facility];  Service: Gynecology;  Laterality: N/A;  . NO PAST SURGERIES    . SALPINGOOPHORECTOMY  01/14/2012   Procedure: SALPINGO OOPHERECTOMY;  Surgeon: Avel Sensor, MD;  Location: [facility];  Service: Gynecology;  Laterality: Right;     OB History   No obstetric history on file.      Home Medications    Prior to Admission medications   Medication Sig Start Date End Date Taking? Authorizing Provider  amLODipine (NORVASC) 5 MG tablet Take 5 mg by mouth daily. 08/22/18  Yes [provider]  omeprazole (PRILOSEC) 20 MG capsule Take 1 capsule (20 mg total) by mouth daily. Patient not taking: Reported on 08/26/2018 08/04/17   McDonald, Mia A, PA-C  ondansetron (ZOFRAN ODT) 4 MG disintegrating tablet Take 1 tablet (4 mg total) by mouth every 8 (eight) hours as needed for nausea or vomiting. Patient not taking: Reported on 08/26/2018 08/04/17   Joanne Gavel, PA-C    Family History Family History  Problem Relation Age of Onset  . Breast cancer Neg Hx     Social History Social History   Tobacco Use  . Smoking status: Never Smoker  . Smokeless tobacco: Never Used  Substance Use Topics  . Alcohol use: No  . Drug use: No     Allergies   Patient has no known allergies.   Review of Systems Review of Systems  Constitutional: Positive for activity change.  Cardiovascular: Positive for  chest pain.  Gastrointestinal: Negative for nausea and vomiting.  Neurological: Positive for numbness.  All other systems reviewed and are negative.    Physical Exam Updated Vital Signs BP (!) 125/101   Pulse 96   Temp 98 F (36.7 C) (Oral)   Resp (!) 24   Ht 5' (1.524 m)   Wt 69.9 kg   SpO2 100%   BMI 30.08 kg/m   Physical Exam Vitals signs and nursing note reviewed.  Constitutional:      Appearance: She is well-developed.  HENT:     Head: Normocephalic and atraumatic.  Neck:     Musculoskeletal: Normal  range of motion and neck supple.     Comments: No midline c-spine tenderness, pt able to turn head to 45 degrees bilaterally without any pain and able to flex neck to the chest and extend without any pain or neurologic symptoms.  Cardiovascular:     Rate and Rhythm: Normal rate.     Pulses:          Radial pulses are 2+ on the right side and 2+ on the left side.       Dorsalis pedis pulses are 2+ on the right side and 2+ on the left side.     Heart sounds: Normal heart sounds.     Comments: No bruits Pulmonary:     Effort: Pulmonary effort is normal.  Abdominal:     General: Bowel sounds are normal.  Musculoskeletal:     Right lower leg: She exhibits no tenderness. No edema.     Left lower leg: She exhibits no tenderness. No edema.  Skin:    General: Skin is warm and dry.  Neurological:     Mental Status: She is alert and oriented to person, place, and time.      ED Treatments / Results  Labs (all labs ordered are listed, but only abnormal results are displayed) Labs Reviewed  BASIC METABOLIC PANEL - Abnormal; Notable for the following components:      Result Value   Potassium 3.4 (*)    All other components within normal limits  CBC  TROPONIN I  TROPONIN I  I-STAT BETA HCG BLOOD, ED ([facility], [facility], [facility] ONLY)  I-STAT TROPONIN, ED    EKG EKG Interpretation  Date/Time:  Tuesday August 26 2018 15:04:22 EDT Ventricular Rate:  88 PR Interval:    QRS Duration: 104 QT Interval:  399 QTC Calculation: 483 R Axis:   40 Text Interpretation:  Sinus rhythm Consider left atrial enlargement Low voltage, extremity and precordial leads No acute changes No significant change since last tracing Confirmed by Varney Biles [phone]) on 08/26/2018 3:25:18 PM Also confirmed by Varney Biles [phone]), editor Philomena Doheny [phone])  on 08/26/2018 4:44:18 PM   Radiology Dg Chest 2 View  Result Date: 08/26/2018 CLINICAL DATA:  Chest pain. EXAM: CHEST - 2 VIEW COMPARISON:  Radiographs of August 04, 2017. FINDINGS: The heart size and mediastinal contours are within normal limits. Both lungs are clear. No pneumothorax or pleural effusion is noted. The visualized skeletal structures are unremarkable. IMPRESSION: No active cardiopulmonary disease. Electronically Signed   By: Marijo Conception, M.D.   On: 08/26/2018 15:41    Procedures Procedures (including critical care time)  Medications Ordered in ED Medications  nitroGLYCERIN (NITROSTAT) SL tablet 0.4 mg (0.4 mg Sublingual Given 08/26/18 1626)  sodium chloride flush (NS) 0.9 % injection 3 mL (3 mLs Intravenous Given 08/26/18 1627)  aspirin chewable tablet 324 mg (324  mg Oral Given 08/26/18 1625)     Initial Impression / Assessment and Plan / ED Course  I have reviewed the triage vital signs and the nursing notes.  Pertinent labs & imaging results that were available during my care of the patient were reviewed by me and considered in my medical decision making (see chart for details).     HEAR Score: 65  48 year old female comes in a chief complaint of chest discomfort.  She has history of hypertension, otherwise her medical history, social history and family history are unremarkable.  Chest pain is midsternal and right-sided.  She also has tingling sensation in her right forearm.  Patient is noted to have mild shoulder discomfort without any significant range of motion abnormalities.  Differential diagnosis included cervical or brachial radiculopathy.  Additionally we also considered ACS, PE in the differential diagnosis.  Patient is well score is 0 and she is PERC negative.  We informed her that the possibility of PE is extremely low, however she needs to return to the ER if she starts having worsening in shortness of breath, chest pain or fainting spells.  Her pain is not pleuritic in nature.  Atypical presentation of ACS is also possible.  Delta troponin ordered.  Patient given nitroglycerin.  Additionally we also considered  esophageal spasms for her discomfort.  8:50 PM Patient was reassessed at 8:15 PM.  She has delta troponin that are negative.  She reports that nitroglycerin did relieve her pain and has not come back.  She still having tingling sensation in her upper extremity.  We have advised her to call PCP tomorrow.  If the PCP can get her in right away then she needs to just follow-up with them for further evaluation.  If the PCP is unable to connect with the patient then she should consider calling cardiology for a follow-up.  In the interim strict ER return precautions have been discussed for both PE and ACS.  She will return to the ER if she starts having worsening chest pain, shortness of breath.  Final Clinical Impressions(s) / ED Diagnoses   Final diagnoses:  Nonspecific chest pain  Numbness and tingling of right arm    ED Discharge Orders    None       Varney Biles, MD 08/26/18 2050

## 2018-08-26 NOTE — ED Notes (Signed)
Pt reports very little relief from Nitroglycerin in her chest, and no change in pain in right arm.

## 2018-08-26 NOTE — ED Triage Notes (Signed)
Patient c/o central chest aching x1 hour with SOB. Denies N/V/D, cough.

## 2018-08-26 NOTE — Discharge Instructions (Signed)
We saw you in the ER for the chest pain, arm numbness. All of our cardiac workup is normal, including labs, EKG and chest X-RAY are normal. We are not sure what is causing your discomfort, but we feel comfortable sending you home at this time. The workup in the ER is not complete, and you should follow up with your primary care doctor for further evaluation.  Initial impression is that the arm symptoms could be because of nerve impingement in your shoulder.  The chest pain still could be cardiac in nature -and return precautions are listed below.  Please return to the ER if you have worsening chest pain, shortness of breath, pain radiating to your jaw, shoulder, or back, sweats or fainting. Otherwise see the Cardiologist or your primary care doctor as requested.

## 2018-08-26 NOTE — ED Notes (Signed)
Patient transported to X-ray 

## 2019-09-09 ENCOUNTER — Other Ambulatory Visit: Payer: Self-pay | Admitting: Nurse Practitioner

## 2019-09-09 DIAGNOSIS — Z1231 Encounter for screening mammogram for malignant neoplasm of breast: Secondary | ICD-10-CM

## 2019-09-15 ENCOUNTER — Ambulatory Visit: Payer: BLUE CROSS/BLUE SHIELD

## 2019-09-17 ENCOUNTER — Ambulatory Visit
Admission: RE | Admit: 2019-09-17 | Discharge: 2019-09-17 | Disposition: A | Discharge status: Home / Self Care | Payer: BC Managed Care – PPO | Source: Ambulatory Visit | Attending: Nurse Practitioner | Admitting: Nurse Practitioner

## 2019-09-17 ENCOUNTER — Other Ambulatory Visit: Payer: Self-pay

## 2019-09-17 DIAGNOSIS — Z1231 Encounter for screening mammogram for malignant neoplasm of breast: Secondary | ICD-10-CM

## 2020-08-17 ENCOUNTER — Emergency Department (HOSPITAL_COMMUNITY)
Admission: EM | Admit: 2020-08-17 | Discharge: 2020-08-17 | Disposition: A | Discharge status: Home / Self Care | Payer: BC Managed Care – PPO | Attending: Emergency Medicine | Admitting: Emergency Medicine

## 2020-08-17 ENCOUNTER — Other Ambulatory Visit: Payer: Self-pay

## 2020-08-17 ENCOUNTER — Encounter (HOSPITAL_COMMUNITY): Payer: Self-pay

## 2020-08-17 DIAGNOSIS — Z79899 Other long term (current) drug therapy: Secondary | ICD-10-CM | POA: Diagnosis not present

## 2020-08-17 DIAGNOSIS — I10 Essential (primary) hypertension: Secondary | ICD-10-CM | POA: Insufficient documentation

## 2020-08-17 DIAGNOSIS — N644 Mastodynia: Secondary | ICD-10-CM

## 2020-08-17 MED ORDER — NAPROXEN 500 MG PO TABS: 500.0000 mg | ORAL_TABLET | Freq: Two times a day (BID) | ORAL | 0 refills | Status: DC

## 2020-08-17 MED ORDER — ACETAMINOPHEN 325 MG PO TABS
650.0000 mg | ORAL_TABLET | Freq: Once | ORAL | Status: AC
  Administered 2020-08-17: 650 mg via ORAL
  Filled 2020-08-17: qty 2

## 2020-08-17 NOTE — ED Notes (Signed)
Observer for breast exam with Brittni, PA. Pt tolerated well.

## 2020-08-17 NOTE — ED Provider Notes (Signed)
[facility] DEPT Provider Note   CSN: [id number] Arrival date & time: 08/17/20  1754    History Chief Complaint  Patient presents with  . Breast Pain    Yolanda Berry is a 50 y.o. female with past medical history significant for hypertension who presents for evaluation of right breast pain.  Began 2 weeks ago.  She feels like she palpates a mass when she palpates her right breast.  She has not noted any overlying erythema, warmth to breast.  Pain does not radiate.  She has not noted any nipple changes.  States she gets mammograms yearly and has not noted any masses.  She is unsure of her last mammogram.  She does have a primary care provider.  She has not taken anything for her symptoms.  She denies fever, chills, nausea, vomiting, chest pain, shortness of breath, cough, hemoptysis, recent falls, trauma, injury, abdominal pain, diarrhea, dysuria.  Denies additional aggravating or alleviating factors.   Rates pain a 6/10.  History obtained from patient and past medical records.  No interpreter used  HPI     Past Medical History:  Diagnosis Date  . Fibroids   . Headache(784.0)   . Hypertension   . Seasonal allergies     There are no problems to display for this patient.   Past Surgical History:  Procedure Laterality Date  . ABDOMINAL HYSTERECTOMY  01/14/2012   Procedure: HYSTERECTOMY ABDOMINAL;  Surgeon: Avel Sensor, MD;  Location: [facility];  Service: Gynecology;  Laterality: N/A;  . NO PAST SURGERIES    . SALPINGOOPHORECTOMY  01/14/2012   Procedure: SALPINGO OOPHERECTOMY;  Surgeon: Avel Sensor, MD;  Location: [facility];  Service: Gynecology;  Laterality: Right;     OB History   No obstetric history on file.     Family History  Problem Relation Age of Onset  . Breast cancer Neg Hx     Social History   Tobacco Use  . Smoking status: Never Smoker  . Smokeless tobacco: Never Used  Substance Use Topics  . Alcohol use: No  . Drug  use: No    Home Medications Prior to Admission medications   Medication Sig Start Date End Date Taking? Authorizing Provider  naproxen (NAPROSYN) 500 MG tablet Take 1 tablet (500 mg total) by mouth 2 (two) times daily. 08/17/20  Yes Allex Lapoint A, PA-C  amLODipine (NORVASC) 5 MG tablet Take 5 mg by mouth daily. 08/22/18   [provider]  omeprazole (PRILOSEC) 20 MG capsule Take 1 capsule (20 mg total) by mouth daily. Patient not taking: Reported on 08/26/2018 08/04/17   McDonald, Mia A, PA-C  ondansetron (ZOFRAN ODT) 4 MG disintegrating tablet Take 1 tablet (4 mg total) by mouth every 8 (eight) hours as needed for nausea or vomiting. Patient not taking: Reported on 08/26/2018 08/04/17   Joline Maxcy A, PA-C    Allergies    Patient has no known allergies.  Review of Systems   Review of Systems  Constitutional: Negative.   HENT: Negative.   Respiratory: Negative.   Cardiovascular: Negative.   Gastrointestinal: Negative.   Genitourinary: Negative.   Musculoskeletal: Negative.   Skin: Negative.   Neurological: Negative.   All other systems reviewed and are negative.   Physical Exam Updated Vital Signs BP (!) 168/102 (BP Location: Left Arm)   Pulse 67   Temp 97.6 F (36.4 C) (Oral)   Resp 18   Ht 5' (1.524 m)   Wt 68.9 kg  SpO2 100%   BMI 29.69 kg/m   Physical Exam Vitals and nursing note reviewed. Exam conducted with a chaperone present.  Constitutional:      General: She is not in acute distress.    Appearance: She is well-developed. She is not ill-appearing, toxic-appearing or diaphoretic.  HENT:     Head: Normocephalic and atraumatic.     Nose: Nose normal.     Mouth/Throat:     Mouth: Mucous membranes are moist.  Eyes:     Pupils: Pupils are equal, round, and reactive to light.  Cardiovascular:     Rate and Rhythm: Normal rate.     Pulses: Normal pulses.     Heart sounds: Normal heart sounds.  Pulmonary:     Effort: Pulmonary effort is normal.  No respiratory distress.     Breath sounds: Normal breath sounds.  Chest:  Breasts:     Right: Mass and tenderness present. No swelling, bleeding, inverted nipple, nipple discharge or skin change.     Left: Normal.        Comments: RN present in room for exam.  Tenderness to 10 o'clock position to right breast.  Palpable 2cm rounded, mobile lesion.  No breast induration, fluctuance.  No surrounding erythema, warmth.  No nipple discharge. Left breast without any significant abnormality.  No bilateral axillary lymphadenopathy. Abdominal:     General: Bowel sounds are normal. There is no distension.     Tenderness: There is no abdominal tenderness.  Musculoskeletal:        General: No swelling or tenderness. Normal range of motion.     Cervical back: Normal range of motion.     Right lower leg: No edema.     Left lower leg: No edema.  Skin:    General: Skin is warm and dry.     Capillary Refill: Capillary refill takes less than 2 seconds.     Comments: No edema, erythema or warmth.  No fluctuance or induration.  Neurological:     General: No focal deficit present.     Mental Status: She is alert and oriented to person, place, and time.     Cranial Nerves: Cranial nerves are intact.     Sensory: Sensation is intact.     Gait: Gait is intact.    ED Results / Procedures / Treatments   Labs (all labs ordered are listed, but only abnormal results are displayed) Labs Reviewed - No data to display  EKG None  Radiology No results found.  Procedures Procedures   Medications Ordered in ED Medications  acetaminophen (TYLENOL) tablet 650 mg (has no administration in time range)    ED Course  I have reviewed the triage vital signs and the nursing notes.  Pertinent labs & imaging results that were available during my care of the patient were reviewed by me and considered in my medical decision making (see chart for details).  50 year old here for evaluation of right breast pain.   She is afebrile, nonseptic, non-ill-appearing.  Began 2 weeks ago.  No chest pain, shortness of breath, hemoptysis, back pain to suggest atypical etiology of symptoms.  Does have 2 cm tender deep lesion to 10 o'clock position of the right breast.  She is no fluctuance or induration.  She is no erythema or warmth.  No changes to nipple overlying skin.  Does get mammograms yearly however does not member her last mammogram.  No family history of breast cancer.  No axillary lymphadenopathy. Patient states any masses  mobile.  Given tenderness without any overlying skin changes to indicate abscess, cellulitis.  She needs close follow-up with PCP for possible mammogram versus ultrasound as I do not see infectious process.  Discussed home management for pain.  She is agreeable to this.  The patient has been appropriately medically screened and/or stabilized in the ED. I have low suspicion for any other emergent medical condition which would require further screening, evaluation or treatment in the ED or require inpatient management.  Patient is hemodynamically stable and in no acute distress.  Patient able to ambulate in department prior to ED.  Evaluation does not show acute pathology that would require ongoing or additional emergent interventions while in the emergency department or further inpatient treatment.  I have discussed the diagnosis with the patient and answered all questions.  Pain is been managed while in the emergency department and patient has no further complaints prior to discharge.  Patient is comfortable with plan discussed in room and is stable for discharge at this time.  I have discussed strict return precautions for returning to the emergency department.  Patient was encouraged to follow-up with PCP/specialist refer to at discharge.    MDM Rules/Calculators/A&P                           Final Clinical Impression(s) / ED Diagnoses Final diagnoses:  Breast pain    Rx / DC Orders ED  Discharge Orders         Ordered    naproxen (NAPROSYN) 500 MG tablet  2 times daily        08/17/20 1934           Manya Balash A, PA-C 08/17/20 Mardelle Matte, MD 08/18/20 1506

## 2020-08-17 NOTE — ED Triage Notes (Signed)
Pt c/o rt breast pain x 2 weeks ago, c/o severe tenderness.

## 2020-08-17 NOTE — Discharge Instructions (Signed)
Take the anti- inflammatories as prescribed.  May take additional Tylenol as needed.  As discussed during your exam.  I do not see any skin changes to breast to suggest infectious process.  I do think you probably need follow-up with primary care provider for a mammogram or an ultrasound outpatient.  Return for new worsening symptoms.

## 2020-08-19 ENCOUNTER — Other Ambulatory Visit: Payer: Self-pay | Admitting: Physician Assistant

## 2020-08-19 DIAGNOSIS — N63 Unspecified lump in unspecified breast: Secondary | ICD-10-CM

## 2020-08-19 DIAGNOSIS — N644 Mastodynia: Secondary | ICD-10-CM

## 2020-08-24 ENCOUNTER — Ambulatory Visit
Admission: RE | Admit: 2020-08-24 | Discharge: 2020-08-24 | Disposition: A | Discharge status: Home / Self Care | Payer: BC Managed Care – PPO | Source: Ambulatory Visit | Attending: Physician Assistant | Admitting: Physician Assistant

## 2020-08-24 ENCOUNTER — Other Ambulatory Visit: Payer: Self-pay

## 2020-08-24 DIAGNOSIS — N63 Unspecified lump in unspecified breast: Secondary | ICD-10-CM

## 2020-08-24 DIAGNOSIS — N644 Mastodynia: Secondary | ICD-10-CM

## 2020-12-01 ENCOUNTER — Other Ambulatory Visit: Payer: Self-pay

## 2020-12-01 ENCOUNTER — Ambulatory Visit (INDEPENDENT_AMBULATORY_CARE_PROVIDER_SITE_OTHER): Payer: BC Managed Care – PPO | Admitting: Plastic Surgery

## 2020-12-01 ENCOUNTER — Encounter: Payer: Self-pay | Admitting: Plastic Surgery

## 2020-12-01 VITALS — BP 149/93 | HR 68 | Ht 60.0 in | Wt 153.8 lb

## 2020-12-01 DIAGNOSIS — M545 Low back pain, unspecified: Secondary | ICD-10-CM

## 2020-12-01 DIAGNOSIS — M546 Pain in thoracic spine: Secondary | ICD-10-CM

## 2020-12-01 DIAGNOSIS — M4004 Postural kyphosis, thoracic region: Secondary | ICD-10-CM | POA: Diagnosis not present

## 2020-12-01 DIAGNOSIS — N62 Hypertrophy of breast: Secondary | ICD-10-CM

## 2020-12-01 NOTE — Progress Notes (Signed)
Referring Provider Johna Roles, [address] [place],  [place] [address]   CC:  Chief Complaint  Patient presents with   Advice Only      Yolanda Berry is an 50 y.o. female.  HPI: Patient presents to discuss breast reduction.  She said years of back pain, neck pain and shoulder grooving related to her large breast.  She tried over-the-counter medications, warm packs, cold packs and supportive bras with little relief.  She also gets rashes beneath her breast that are refractory to over-the-counter treatments.  She is currently a triple D and wants to be smaller but proportional.  No family history of breast cancer.  No prior breast biopsies or procedures.  Last mammogram was in March and was normal.  She does not smoke and is nondiabetic.  No Known Allergies  Outpatient Encounter Medications as of 12/01/2020  Medication Sig   amLODipine (NORVASC) 5 MG tablet Take 5 mg by mouth daily.   [DISCONTINUED] naproxen (NAPROSYN) 500 MG tablet Take 1 tablet (500 mg total) by mouth 2 (two) times daily.   [DISCONTINUED] omeprazole (PRILOSEC) 20 MG capsule Take 1 capsule (20 mg total) by mouth daily. (Patient not taking: Reported on 08/26/2018)   [DISCONTINUED] ondansetron (ZOFRAN ODT) 4 MG disintegrating tablet Take 1 tablet (4 mg total) by mouth every 8 (eight) hours as needed for nausea or vomiting. (Patient not taking: Reported on 08/26/2018)   No facility-administered encounter medications on file as of 12/01/2020.     Past Medical History:  Diagnosis Date   Fibroids    Headache(784.0)    Hypertension    Seasonal allergies     Past Surgical History:  Procedure Laterality Date   ABDOMINAL HYSTERECTOMY  01/14/2012   Procedure: HYSTERECTOMY ABDOMINAL;  Surgeon: Avel Sensor, MD;  Location: [facility];  Service: Gynecology;  Laterality: N/A;   NO PAST SURGERIES     SALPINGOOPHORECTOMY  01/14/2012   Procedure: SALPINGO OOPHERECTOMY;  Surgeon: Avel Sensor, MD;   Location: [facility];  Service: Gynecology;  Laterality: Right;    Family History  Problem Relation Age of Onset   Breast cancer Neg Hx     Social History   Social History Narrative   Not on file     Review of Systems General: Denies fevers, chills, weight loss CV: Denies chest pain, shortness of breath, palpitations  Physical Exam Vitals with BMI 12/01/2020 08/17/2020 08/17/2020  Height 5\' 0"  - -  Weight 153 lbs 13 oz - -  BMI 42.87 - -  Systolic 681 157 262  Diastolic 93 88 035  Pulse 68 70 67    General:  No acute distress,  Alert and oriented, Non-Toxic, Normal speech and affect Breast: She has grade 3 ptosis.  Sternal notch to nipple is 30 cm bilaterally.  Nipple to fold is 13 cm bilaterally.  No obvious scars or masses.  Assessment/Plan The patient has bilateral symptomatic macromastia.  She is a good candidate for a breast reduction.  She is interested in pursuing surgical treatment.  She has tried supportive garments and fitted bras with no relief.  The details of breast reduction surgery were discussed.  I explained the procedure in detail along the with the expected scars.  The risks were discussed in detail and include bleeding, infection, damage to surrounding structures, need for additional procedures, nipple loss, change in nipple sensation, persistent pain, contour irregularities and asymmetries.  I explained that breast feeding is often not possible  after breast reduction surgery.  We discussed the expected postoperative course with an overall recovery period of about 1 month.  She demonstrated full understanding of all risks.  We discussed her personal risk factors.  The patient is interested in pursuing surgical treatment.  I anticipate approximately 400g of tissue removed from each side.   Yolanda Berry 12/01/2020, 10:45 AM

## 2021-01-23 ENCOUNTER — Telehealth: Payer: Self-pay | Admitting: Plastic Surgery

## 2021-01-23 NOTE — Telephone Encounter (Signed)
Numerous attempts have been made to contact patient regarding approval for surgery. The phone numbers listed are no longer in service, including the emergency contact. If patient calls, please obtain a correct phone number.

## 2021-05-11 ENCOUNTER — Ambulatory Visit (INDEPENDENT_AMBULATORY_CARE_PROVIDER_SITE_OTHER): Payer: BC Managed Care – PPO | Admitting: Surgical

## 2021-05-11 ENCOUNTER — Other Ambulatory Visit: Payer: Self-pay

## 2021-05-11 VITALS — Ht 60.0 in | Wt 155.0 lb

## 2021-05-11 DIAGNOSIS — M546 Pain in thoracic spine: Secondary | ICD-10-CM

## 2021-05-11 DIAGNOSIS — N62 Hypertrophy of breast: Secondary | ICD-10-CM

## 2021-05-11 DIAGNOSIS — M4004 Postural kyphosis, thoracic region: Secondary | ICD-10-CM

## 2021-05-11 DIAGNOSIS — M545 Low back pain, unspecified: Secondary | ICD-10-CM | POA: Diagnosis not present

## 2021-05-11 NOTE — Progress Notes (Signed)
° °  Referring Provider Dianna Rossetti, NP [address] [place],  [place] [address]   CC:  Chief Complaint  Patient presents with   Follow-up      Yolanda Berry is an 50 y.o. female.  HPI: Patient is a 50 year old female here for follow-up to discuss breast reduction.  She initially saw Dr. Claudia Desanctis in July 2022 for consultation for breast reduction.  She had insurance approval for this but did not schedule the surgery due to communication mishaps.  She reports that she is still interested in bilateral breast reduction.  She discussed with Dr. Claudia Desanctis that she had back pain, neck pain and shoulder pain.  She has tried over-the-counter medications, warm packs cold packs and supportive bras with little relief.  She is currently a triple D and wants to be smaller.  She has no family or personal history of breast cancer.  No previous breast biopsies or procedures.  She had a mammogram within the last year that was normal.  She is not a smoker or diabetic.  She does not report any changes in her health since her last visit in July 2022.  Review of Systems General: No fevers or chills  Physical Exam Vitals with BMI 05/11/2021 12/01/2020 08/17/2020  Height 5\' 0"  5\' 0"  -  Weight 155 lbs 153 lbs 13 oz -  BMI 77.41 28.78 -  Systolic - 676 720  Diastolic - 93 88  Pulse - 68 70    General:  No acute distress,  Alert and oriented, Non-Toxic, Normal speech and affect Pulmonary: Breathing is unlabored. Behavior: Mood and behavior is normal.  Assessment/Plan The patient is a good candidate for bilateral breast reduction.  She is interested in pursuing surgical treatment.  She has not had any changes since her initial consult with Dr. Claudia Desanctis.  Anticipate approximately 400 g of tissue removed from each side.  Will route this to surgical scheduling team, insurance authorization may still be valid, if not we will resubmit.  Carola Rhine Addalyne Vandehei 05/11/2021, 8:46 AM

## 2021-06-30 ENCOUNTER — Telehealth: Payer: Self-pay | Admitting: Plastic Surgery

## 2021-06-30 NOTE — Telephone Encounter (Signed)
LVM for call back

## 2021-07-13 NOTE — Progress Notes (Signed)
Surgical Instructions    Your procedure is scheduled on Monday, February 27th.  Report to [facility] Main Entrance "A" at 7:30 A.M., then check in with the Admitting office.  Call this number if you have problems the morning of surgery:  [phone]   If you have any questions prior to your surgery date call [phone]: Open Monday-Friday 8am-4pm    Remember:  Do not eat after midnight the night before your surgery  You may drink clear liquids until 6:30 AM the morning of your surgery.   Clear liquids allowed are: Water, Non-Citrus Juices (without pulp), Carbonated Beverages, Clear Tea, Black Coffee ONLY (NO MILK, CREAM OR POWDERED CREAMER of any kind), and Gatorade    Take these medicines the morning of surgery with A SIP OF WATER:   Tylenol - if needed   As of today, STOP taking any Aspirin (unless otherwise instructed by your surgeon) Aleve, Naproxen, Ibuprofen, Motrin, Advil, Goody's, BC's, all herbal medications, fish oil, and all vitamins.           DAY OF SURGERY: Do not wear jewelry or makeup Do not wear lotions, powders, perfumes, or deodorant. Do not shave 48 hours prior to surgery.   Do not bring valuables to the hospital. Do not wear nail polish, gel polish, artificial nails, or any other type of covering on natural nails (fingers and toes) If you have artificial nails or gel coating that need to be removed by a nail salon, please have this removed prior to surgery. Artificial nails or gel coating may interfere with anesthesia's ability to adequately monitor your vital signs.  [facility] is not responsible for any belongings or valuables. .   Do NOT Smoke (Tobacco/Vaping)  24 hours prior to your procedure  If you use a CPAP at night, you may bring your mask for your overnight stay.   Contacts, glasses, hearing aids, dentures or partials may not be worn into surgery, please bring cases for these belongings   For patients admitted to the hospital, discharge time  will be determined by your treatment team.   Patients discharged the day of surgery will not be allowed to drive home, and someone needs to stay with them for 24 hours.  NO VISITORS WILL BE ALLOWED IN PRE-OP WHERE PATIENTS ARE PREPPED FOR SURGERY.  ONLY 1 SUPPORT PERSON MAY BE PRESENT IN THE WAITING ROOM WHILE YOU ARE IN SURGERY.  IF YOU ARE TO BE ADMITTED, ONCE YOU ARE IN YOUR ROOM YOU WILL BE ALLOWED TWO (2) VISITORS. 1 (ONE) VISITOR MAY STAY OVERNIGHT BUT MUST ARRIVE TO THE ROOM BY 8pm.  Minor children may have two parents present. Special consideration for safety and communication needs will be reviewed on a case by case basis.  Special instructions:    Oral Hygiene is also important to reduce your risk of infection.  Remember - BRUSH YOUR TEETH THE MORNING OF SURGERY WITH YOUR REGULAR TOOTHPASTE   [facility]- Preparing For Surgery  Before surgery, you can play an important role. Because skin is not sterile, your skin needs to be as free of germs as possible. You can reduce the number of germs on your skin by washing with CHG (chlorahexidine gluconate) Soap before surgery.  CHG is an antiseptic cleaner which kills germs and bonds with the skin to continue killing germs even after washing.     Please do not use if you have an allergy to CHG or antibacterial soaps. If your skin becomes reddened/irritated stop using the  CHG.  Do not shave (including legs and underarms) for at least 48 hours prior to first CHG shower. It is OK to shave your face.  Please follow these instructions carefully.     Shower the NIGHT BEFORE SURGERY and the MORNING OF SURGERY with CHG Soap.   If you chose to wash your hair, wash your hair first as usual with your normal shampoo. After you shampoo, rinse your hair and body thoroughly to remove the shampoo.  Then [employer] and genitals (private parts) with your normal soap and rinse thoroughly to remove soap.  After that Use CHG Soap as you would any other liquid  soap. You can apply CHG directly to the skin and wash gently with a scrungie or a clean washcloth.   Apply the CHG Soap to your body ONLY FROM THE NECK DOWN.  Do not use on open wounds or open sores. Avoid contact with your eyes, ears, mouth and genitals (private parts). Wash Face and genitals (private parts)  with your normal soap.   Wash thoroughly, paying special attention to the area where your surgery will be performed.  Thoroughly rinse your body with warm water from the neck down.  DO NOT shower/wash with your normal soap after using and rinsing off the CHG Soap.  Pat yourself dry with a CLEAN TOWEL.  Wear CLEAN PAJAMAS to bed the night before surgery  Place CLEAN SHEETS on your bed the night before your surgery  DO NOT SLEEP WITH PETS.   Day of Surgery:  Take a shower with CHG soap. Wear Clean/Comfortable clothing the morning of surgery Do not apply any deodorants/lotions.   Remember to brush your teeth WITH YOUR REGULAR TOOTHPASTE.  Please read over the following fact sheets that you were given.

## 2021-07-14 ENCOUNTER — Encounter (HOSPITAL_COMMUNITY): Payer: Self-pay

## 2021-07-14 ENCOUNTER — Encounter (HOSPITAL_COMMUNITY)
Admission: RE | Admit: 2021-07-14 | Discharge: 2021-07-14 | Disposition: A | Discharge status: Home / Self Care | Payer: BC Managed Care – PPO | Source: Ambulatory Visit | Attending: Plastic Surgery | Admitting: Plastic Surgery

## 2021-07-14 ENCOUNTER — Ambulatory Visit (INDEPENDENT_AMBULATORY_CARE_PROVIDER_SITE_OTHER): Payer: BC Managed Care – PPO | Admitting: Surgical

## 2021-07-14 ENCOUNTER — Encounter: Payer: Self-pay | Admitting: Surgical

## 2021-07-14 ENCOUNTER — Other Ambulatory Visit: Payer: Self-pay

## 2021-07-14 VITALS — BP 153/96 | HR 98 | Temp 98.7°F | Resp 17 | Ht 60.0 in | Wt 157.0 lb

## 2021-07-14 VITALS — BP 165/101 | HR 87 | Ht 60.0 in | Wt 156.6 lb

## 2021-07-14 DIAGNOSIS — Z719 Counseling, unspecified: Secondary | ICD-10-CM

## 2021-07-14 DIAGNOSIS — M545 Low back pain, unspecified: Secondary | ICD-10-CM

## 2021-07-14 DIAGNOSIS — Z01818 Encounter for other preprocedural examination: Secondary | ICD-10-CM | POA: Diagnosis not present

## 2021-07-14 DIAGNOSIS — I10 Essential (primary) hypertension: Secondary | ICD-10-CM | POA: Diagnosis not present

## 2021-07-14 DIAGNOSIS — N62 Hypertrophy of breast: Secondary | ICD-10-CM

## 2021-07-14 DIAGNOSIS — M4004 Postural kyphosis, thoracic region: Secondary | ICD-10-CM

## 2021-07-14 DIAGNOSIS — M546 Pain in thoracic spine: Secondary | ICD-10-CM

## 2021-07-14 LAB — CBC
HCT: 37.4 % (ref 36.0–46.0)
Hemoglobin: 12.4 g/dL (ref 12.0–15.0)
MCH: 30.3 pg (ref 26.0–34.0)
MCHC: 33.2 g/dL (ref 30.0–36.0)
MCV: 91.4 fL (ref 80.0–100.0)
Platelets: 267 10*3/uL (ref 150–400)
RBC: 4.09 MIL/uL (ref 3.87–5.11)
RDW: 13.7 % (ref 11.5–15.5)
WBC: 9.8 10*3/uL (ref 4.0–10.5)
nRBC: 0 % (ref 0.0–0.2)

## 2021-07-14 LAB — BASIC METABOLIC PANEL
Anion gap: 9 (ref 5–15)
BUN: 10 mg/dL (ref 6–20)
CO2: 25 mmol/L (ref 22–32)
Calcium: 9 mg/dL (ref 8.9–10.3)
Chloride: 105 mmol/L (ref 98–111)
Creatinine, Ser: 1.08 mg/dL — ABNORMAL HIGH (ref 0.44–1.00)
GFR, Estimated: >60 mL/min (ref >60)
Glucose, Bld: 100 mg/dL — ABNORMAL HIGH (ref 70–99)
Potassium: 3.6 mmol/L (ref 3.5–5.1)
Sodium: 139 mmol/L (ref 135–145)

## 2021-07-14 MED ORDER — ONDANSETRON HCL 4 MG PO TABS: 4.0000 mg | ORAL_TABLET | Freq: Three times a day (TID) | ORAL | 0 refills | Status: DC | PRN

## 2021-07-14 MED ORDER — OXYCODONE HCL 5 MG PO TABS: 5.0000 mg | ORAL_TABLET | Freq: Four times a day (QID) | ORAL | 0 refills | Status: AC | PRN

## 2021-07-14 NOTE — Progress Notes (Signed)
Patient ID: Yolanda Berry, female    DOB: 20-Jun-1970, 51 y.o.   MRN: [id number]  Chief Complaint  Patient presents with   Pre-op Exam      ICD-10-CM   1. Macromastia  N62     2. Back pain of thoracolumbar region  M54.50    M54.6     3. Postural kyphosis, thoracic region  M40.04       History of Present Illness: Yolanda Berry is a 51 y.o.  female  with a history of macromastia.  She presents for preoperative evaluation for upcoming procedure, Bilateral Breast Reduction, scheduled for 07/24/2021 with Dr.  Claudia Desanctis  The patient has not had problems with anesthesia. No history of DVT/PE.  No family history of DVT/PE.  No family or personal history of bleeding or clotting disorders.  Patient is not currently taking any blood thinners.  No history of CVA/MI.  He is Summary of Previous Visit: She is currently a triple D and wants to be smaller, but proportional.  No family history of breast cancer.  No previous breast biopsies or procedures.  She does not smoke.  She is not a diabetic.  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  She reports she is feeling well lately, no recent changes to her health.  She is not having any infectious symptoms.  No chest pain or shortness of breath.   Past Medical History: Allergies: No Known Allergies  Current Medications:  Current Outpatient Medications:    acetaminophen (TYLENOL) 650 MG CR tablet, Take 1,300 mg by mouth every 8 (eight) hours as needed for pain., Disp: , Rfl:   Past Medical Problems: Past Medical History:  Diagnosis Date   Fibroids    Headache(784.0)    Hypertension    Seasonal allergies     Past Surgical History: Past Surgical History:  Procedure Laterality Date   ABDOMINAL  HYSTERECTOMY  01/14/2012   Procedure: HYSTERECTOMY ABDOMINAL;  Surgeon: Avel Sensor, MD;  Location: [facility];  Service: Gynecology;  Laterality: N/A;   NO PAST SURGERIES     SALPINGOOPHORECTOMY  01/14/2012   Procedure: SALPINGO OOPHERECTOMY;  Surgeon: Avel Sensor, MD;  Location: [facility];  Service: Gynecology;  Laterality: Right;    Social History: Social History   Socioeconomic History   Marital status: Single    Spouse name: Not on file   Number of children: Not on file   Years of education: Not on file   Highest education level: Not on file  Occupational History   Not on file  Tobacco Use   Smoking status: Never   Smokeless tobacco: Never  Substance and Sexual Activity   Alcohol use: No   Drug use: No   Sexual activity: Not Currently  Other Topics Concern   Not on file  Social History Narrative   Not on file   Social Determinants of Health   Financial Resource Strain: Not on file  Food Insecurity: Not on file  Transportation Needs: Not on file  Physical Activity: Not on file  Stress: Not on file  Social Connections: Not on file  Intimate Partner Violence: Not on file    Family History: Family History  Problem Relation Age of Onset   Breast cancer Neg Hx  Review of Systems: Review of Systems  Constitutional: Negative.   Respiratory: Negative.    Cardiovascular: Negative.    Physical Exam: Vital Signs BP (!) 165/101 (BP Location: Left Arm, Patient Position: Sitting, Cuff Size: Normal)    Pulse 87    Ht 5' (1.524 m)    Wt 156 lb 9.6 oz (71 kg)    SpO2 98%    BMI 30.58 kg/m   Physical Exam  Constitutional:      General: Not in acute distress.    Appearance: Normal appearance. Not ill-appearing.  HENT:     Head: Normocephalic and atraumatic.  Eyes:     Pupils: Pupils are equal, round Neck:     Musculoskeletal: Normal range of motion.  Cardiovascular:     Rate and Rhythm: Normal rate    Pulses: Normal pulses.  Pulmonary:     Effort:  Pulmonary effort is normal. No respiratory distress.  Musculoskeletal: Normal range of motion.  Skin:    General: Skin is warm and dry.     Findings: No erythema or rash.  Neurological:     General: No focal deficit present.     Mental Status: Alert and oriented to person, place, and time. Mental status is at baseline.     Motor: No weakness.  Psychiatric:        Mood and Affect: Mood normal.        Behavior: Behavior normal.    Assessment/Plan: The patient is scheduled for bilateral breast reduction with Dr. Claudia Desanctis.  Risks, benefits, and alternatives of procedure discussed, questions answered and consent obtained.    Smoking Status: Non-smoker; Counseling Given?  N/A Last Mammogram: March 2022; Results: Normal, subsequent ultrasound which was also normal  Caprini Score: 4, moderate; Risk Factors include: Age, BMI greater than 25, and length of planned surgery. Recommendation for mechanical prophylaxis. Encourage early ambulation.   Pictures obtained: @consult   Post-op Rx sent to pharmacy:  Oxycodone, Zofran  Patient was provided with the breast reduction and General Surgical Risk consent document and Pain Medication Agreement prior to their appointment.  They had adequate time to read through the risk consent documents and Pain Medication Agreement. We also discussed them in person together during this preop appointment. All of their questions were answered to their satisfaction.  Recommended calling if they have any further questions.  Risk consent form and Pain Medication Agreement to be scanned into patient's chart.  The risk that can be encountered with breast reduction were discussed and include the following but not limited to these:  Breast asymmetry, fluid accumulation, firmness of the breast, inability to breast feed, loss of nipple or areola, skin loss, decrease or no nipple sensation, fat necrosis of the breast tissue, bleeding, infection, healing delay.  There are risks of  anesthesia, changes to skin sensation and injury to nerves or blood vessels.  The muscle can be temporarily or permanently injured.  You may have an allergic reaction to tape, suture, glue, blood products which can result in skin discoloration, swelling, pain, skin lesions, poor healing.  Any of these can lead to the need for revisonal surgery or stage procedures.  A reduction has potential to interfere with diagnostic procedures.  Nipple or breast piercing can increase risks of infection.  This procedure is best done when the breast is fully developed.  Changes in the breast will continue to occur over time.  Pregnancy can alter the outcomes of previous breast reduction surgery, weight gain and weigh loss can also effect  the long term appearance.    Electronically signed by: Carola Rhine Quetzally Callas, PA-C 07/14/2021 12:17 PM

## 2021-07-14 NOTE — H&P (View-Only) (Signed)
Patient ID: Yolanda Berry, female    DOB: [birth year], 51 y.o.   MRN: [id number]  Chief Complaint  Patient presents with   Pre-op Exam      ICD-10-CM   1. Macromastia  N62     2. Back pain of thoracolumbar region  M54.50    M54.6     3. Postural kyphosis, thoracic region  M40.04       History of Present Illness: Yolanda Berry is a 51 y.o.  female  with a history of macromastia.  She presents for preoperative evaluation for upcoming procedure, Bilateral Breast Reduction, scheduled for 07/24/2021 with Dr.  Claudia Desanctis  The patient has not had problems with anesthesia. No history of DVT/PE.  No family history of DVT/PE.  No family or personal history of bleeding or clotting disorders.  Patient is not currently taking any blood thinners.  No history of CVA/MI.  He is Summary of Previous Visit: She is currently a triple D and wants to be smaller, but proportional.  No family history of breast cancer.  No previous breast biopsies or procedures.  She does not smoke.  She is not a diabetic.  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  She reports she is feeling well lately, no recent changes to her health.  She is not having any infectious symptoms.  No chest pain or shortness of breath.   Past Medical History: Allergies: No Known Allergies  Current Medications:  Current Outpatient Medications:    acetaminophen (TYLENOL) 650 MG CR tablet, Take 1,300 mg by mouth every 8 (eight) hours as needed for pain., Disp: , Rfl:   Past Medical Problems: Past Medical History:  Diagnosis Date   Fibroids    Headache(784.0)    Hypertension    Seasonal allergies     Past Surgical History: Past Surgical History:  Procedure Laterality Date   ABDOMINAL  HYSTERECTOMY  01/14/2012   Procedure: HYSTERECTOMY ABDOMINAL;  Surgeon: Avel Sensor, MD;  Location: [facility];  Service: Gynecology;  Laterality: N/A;   NO PAST SURGERIES     SALPINGOOPHORECTOMY  01/14/2012   Procedure: SALPINGO OOPHERECTOMY;  Surgeon: Avel Sensor, MD;  Location: [facility];  Service: Gynecology;  Laterality: Right;    Social History: Social History   Socioeconomic History   Marital status: Single    Spouse name: Not on file   Number of children: Not on file   Years of education: Not on file   Highest education level: Not on file  Occupational History   Not on file  Tobacco Use   Smoking status: Never   Smokeless tobacco: Never  Substance and Sexual Activity   Alcohol use: No   Drug use: No   Sexual activity: Not Currently  Other Topics Concern   Not on file  Social History Narrative   Not on file   Social Determinants of Health   Financial Resource Strain: Not on file  Food Insecurity: Not on file  Transportation Needs: Not on file  Physical Activity: Not on file  Stress: Not on file  Social Connections: Not on file  Intimate Partner Violence: Not on file    Family History: Family History  Problem Relation Age of Onset   Breast cancer Neg Hx  Review of Systems: Review of Systems  Constitutional: Negative.   Respiratory: Negative.    Cardiovascular: Negative.    Physical Exam: Vital Signs BP (!) 165/101 (BP Location: Left Arm, Patient Position: Sitting, Cuff Size: Normal)    Pulse 87    Ht 5' (1.524 m)    Wt 156 lb 9.6 oz (71 kg)    SpO2 98%    BMI 30.58 kg/m   Physical Exam  Constitutional:      General: Not in acute distress.    Appearance: Normal appearance. Not ill-appearing.  HENT:     Head: Normocephalic and atraumatic.  Eyes:     Pupils: Pupils are equal, round Neck:     Musculoskeletal: Normal range of motion.  Cardiovascular:     Rate and Rhythm: Normal rate    Pulses: Normal pulses.  Pulmonary:     Effort:  Pulmonary effort is normal. No respiratory distress.  Musculoskeletal: Normal range of motion.  Skin:    General: Skin is warm and dry.     Findings: No erythema or rash.  Neurological:     General: No focal deficit present.     Mental Status: Alert and oriented to person, place, and time. Mental status is at baseline.     Motor: No weakness.  Psychiatric:        Mood and Affect: Mood normal.        Behavior: Behavior normal.    Assessment/Plan: The patient is scheduled for bilateral breast reduction with Dr. Claudia Desanctis.  Risks, benefits, and alternatives of procedure discussed, questions answered and consent obtained.    Smoking Status: Non-smoker; Counseling Given?  N/A Last Mammogram: March 2022; Results: Normal, subsequent ultrasound which was also normal  Caprini Score: 4, moderate; Risk Factors include: Age, BMI greater than 25, and length of planned surgery. Recommendation for mechanical prophylaxis. Encourage early ambulation.   Pictures obtained: @consult   Post-op Rx sent to pharmacy:  Oxycodone, Zofran  Patient was provided with the breast reduction and General Surgical Risk consent document and Pain Medication Agreement prior to their appointment.  They had adequate time to read through the risk consent documents and Pain Medication Agreement. We also discussed them in person together during this preop appointment. All of their questions were answered to their satisfaction.  Recommended calling if they have any further questions.  Risk consent form and Pain Medication Agreement to be scanned into patient's chart.  The risk that can be encountered with breast reduction were discussed and include the following but not limited to these:  Breast asymmetry, fluid accumulation, firmness of the breast, inability to breast feed, loss of nipple or areola, skin loss, decrease or no nipple sensation, fat necrosis of the breast tissue, bleeding, infection, healing delay.  There are risks of  anesthesia, changes to skin sensation and injury to nerves or blood vessels.  The muscle can be temporarily or permanently injured.  You may have an allergic reaction to tape, suture, glue, blood products which can result in skin discoloration, swelling, pain, skin lesions, poor healing.  Any of these can lead to the need for revisonal surgery or stage procedures.  A reduction has potential to interfere with diagnostic procedures.  Nipple or breast piercing can increase risks of infection.  This procedure is best done when the breast is fully developed.  Changes in the breast will continue to occur over time.  Pregnancy can alter the outcomes of previous breast reduction surgery, weight gain and weigh loss can also effect  the long term appearance.    Electronically signed by: Carola Rhine Braya Habermehl, PA-C 07/14/2021 12:17 PM

## 2021-07-14 NOTE — Progress Notes (Addendum)
PCP - Minette Brine with Leodis Binet with Dianna Rossetti, NP Cardiologist - Denies  Chest x-ray - Not indicated EKG - 07/14/21 Stress Test - Denies ECHO - denies Cardiac Cath - Denies  Sleep Study - Yes no OSA  DM - Denies  Aspirin Instructions: On hold   ERAS Protcol - yes  Anesthesia review: Yes abnormal EKG  Patient denies shortness of breath, fever, cough and chest pain at PAT appointment   All instructions explained to the patient, with a verbal understanding of the material. Patient agrees to go over the instructions while at home for a better understanding.  The opportunity to ask questions was provided.

## 2021-07-24 ENCOUNTER — Ambulatory Visit (HOSPITAL_COMMUNITY): Payer: BC Managed Care – PPO | Admitting: Anesthesiology

## 2021-07-24 ENCOUNTER — Encounter (HOSPITAL_COMMUNITY): Payer: Self-pay | Admitting: Plastic Surgery

## 2021-07-24 ENCOUNTER — Encounter (HOSPITAL_COMMUNITY)
Admission: RE | Disposition: A | Discharge status: Home / Self Care | Payer: Self-pay | Source: Home / Self Care | Attending: Plastic Surgery

## 2021-07-24 ENCOUNTER — Other Ambulatory Visit: Payer: Self-pay

## 2021-07-24 ENCOUNTER — Ambulatory Visit (HOSPITAL_COMMUNITY): Payer: BC Managed Care – PPO | Admitting: Emergency Medicine

## 2021-07-24 ENCOUNTER — Ambulatory Visit (HOSPITAL_COMMUNITY)
Admission: RE | Admit: 2021-07-24 | Discharge: 2021-07-24 | Disposition: A | Discharge status: Home / Self Care | Payer: BC Managed Care – PPO | Attending: Plastic Surgery | Admitting: Plastic Surgery

## 2021-07-24 DIAGNOSIS — M546 Pain in thoracic spine: Secondary | ICD-10-CM | POA: Diagnosis not present

## 2021-07-24 DIAGNOSIS — N62 Hypertrophy of breast: Secondary | ICD-10-CM | POA: Insufficient documentation

## 2021-07-24 DIAGNOSIS — M545 Low back pain, unspecified: Secondary | ICD-10-CM | POA: Diagnosis not present

## 2021-07-24 HISTORY — PX: BREAST REDUCTION SURGERY: SHX8

## 2021-07-24 SURGERY — MAMMOPLASTY, REDUCTION
Anesthesia: General | Site: Breast | Laterality: Bilateral

## 2021-07-24 MED ORDER — ALBUTEROL SULFATE HFA 108 (90 BASE) MCG/ACT IN AERS
INHALATION_SPRAY | RESPIRATORY_TRACT | Status: AC
  Filled 2021-07-24: qty 6.7

## 2021-07-24 MED ORDER — MIDAZOLAM HCL 5 MG/5ML IJ SOLN
INTRAMUSCULAR | Status: DC | PRN
  Administered 2021-07-24: 2 mg via INTRAVENOUS

## 2021-07-24 MED ORDER — TRANEXAMIC ACID-NACL 1000-0.7 MG/100ML-% IV SOLN
1000.0000 mg | INTRAVENOUS | Status: AC
  Administered 2021-07-24: 1000 mg via INTRAVENOUS
  Filled 2021-07-24: qty 100

## 2021-07-24 MED ORDER — ACETAMINOPHEN 160 MG/5ML PO SOLN: 325.0000 mg | ORAL | Status: DC | PRN

## 2021-07-24 MED ORDER — METOPROLOL TARTRATE 5 MG/5ML IV SOLN
INTRAVENOUS | Status: AC
  Filled 2021-07-24: qty 5

## 2021-07-24 MED ORDER — ONDANSETRON HCL 4 MG/2ML IJ SOLN
INTRAMUSCULAR | Status: DC | PRN
  Administered 2021-07-24: 4 mg via INTRAVENOUS

## 2021-07-24 MED ORDER — ACETAMINOPHEN 500 MG PO TABS
1000.0000 mg | ORAL_TABLET | Freq: Once | ORAL | Status: DC
  Filled 2021-07-24: qty 2

## 2021-07-24 MED ORDER — FLUMAZENIL 0.5 MG/5ML IV SOLN
INTRAVENOUS | Status: DC | PRN
  Administered 2021-07-24 (×2): 2.5 mg via INTRAVENOUS

## 2021-07-24 MED ORDER — MEPERIDINE HCL 25 MG/ML IJ SOLN: 6.2500 mg | INTRAMUSCULAR | Status: DC | PRN

## 2021-07-24 MED ORDER — CEFAZOLIN SODIUM-DEXTROSE 2-4 GM/100ML-% IV SOLN
2.0000 g | INTRAVENOUS | Status: AC
  Administered 2021-07-24: 2 g via INTRAVENOUS
  Filled 2021-07-24: qty 100

## 2021-07-24 MED ORDER — 0.9 % SODIUM CHLORIDE (POUR BTL) OPTIME
TOPICAL | Status: DC | PRN
  Administered 2021-07-24: 1000 mL

## 2021-07-24 MED ORDER — FLUMAZENIL 0.5 MG/5ML IV SOLN
INTRAVENOUS | Status: AC
  Filled 2021-07-24: qty 5

## 2021-07-24 MED ORDER — ORAL CARE MOUTH RINSE: 15.0000 mL | Freq: Once | OROMUCOSAL | Status: AC

## 2021-07-24 MED ORDER — PROPOFOL 10 MG/ML IV BOLUS
INTRAVENOUS | Status: DC | PRN
  Administered 2021-07-24: 150 mg via INTRAVENOUS

## 2021-07-24 MED ORDER — MIDAZOLAM HCL 2 MG/2ML IJ SOLN
INTRAMUSCULAR | Status: AC
  Filled 2021-07-24: qty 2

## 2021-07-24 MED ORDER — OXYCODONE HCL 5 MG/5ML PO SOLN: 5.0000 mg | Freq: Once | ORAL | Status: DC | PRN

## 2021-07-24 MED ORDER — DEXMEDETOMIDINE (PRECEDEX) IN NS 20 MCG/5ML (4 MCG/ML) IV SYRINGE
PREFILLED_SYRINGE | INTRAVENOUS | Status: DC | PRN
  Administered 2021-07-24 (×2): 4 ug via INTRAVENOUS

## 2021-07-24 MED ORDER — ROCURONIUM BROMIDE 10 MG/ML (PF) SYRINGE
PREFILLED_SYRINGE | INTRAVENOUS | Status: AC
  Filled 2021-07-24: qty 10

## 2021-07-24 MED ORDER — LIDOCAINE 2% (20 MG/ML) 5 ML SYRINGE
INTRAMUSCULAR | Status: DC | PRN
  Administered 2021-07-24: 80 mg via INTRAVENOUS

## 2021-07-24 MED ORDER — CELECOXIB 200 MG PO CAPS
200.0000 mg | ORAL_CAPSULE | Freq: Once | ORAL | Status: AC
  Administered 2021-07-24: 200 mg via ORAL
  Filled 2021-07-24: qty 1

## 2021-07-24 MED ORDER — FENTANYL CITRATE (PF) 250 MCG/5ML IJ SOLN
INTRAMUSCULAR | Status: DC | PRN
  Administered 2021-07-24: 50 ug via INTRAVENOUS
  Administered 2021-07-24: 30 ug via INTRAVENOUS
  Administered 2021-07-24: 100 ug via INTRAVENOUS
  Administered 2021-07-24: 20 ug via INTRAVENOUS
  Administered 2021-07-24 (×2): 25 ug via INTRAVENOUS

## 2021-07-24 MED ORDER — PROPOFOL 10 MG/ML IV BOLUS
INTRAVENOUS | Status: AC
  Filled 2021-07-24: qty 20

## 2021-07-24 MED ORDER — FENTANYL CITRATE (PF) 100 MCG/2ML IJ SOLN
25.0000 ug | INTRAMUSCULAR | Status: DC | PRN
  Administered 2021-07-24: 25 ug via INTRAVENOUS

## 2021-07-24 MED ORDER — DEXAMETHASONE SODIUM PHOSPHATE 10 MG/ML IJ SOLN
INTRAMUSCULAR | Status: DC | PRN
  Administered 2021-07-24: 10 mg via INTRAVENOUS

## 2021-07-24 MED ORDER — BUPIVACAINE HCL (PF) 0.25 % IJ SOLN
INTRAMUSCULAR | Status: AC
  Filled 2021-07-24: qty 30

## 2021-07-24 MED ORDER — CHLORHEXIDINE GLUCONATE 0.12 % MT SOLN
15.0000 mL | Freq: Once | OROMUCOSAL | Status: AC
  Administered 2021-07-24: 15 mL via OROMUCOSAL
  Filled 2021-07-24: qty 15

## 2021-07-24 MED ORDER — DEXAMETHASONE SODIUM PHOSPHATE 10 MG/ML IJ SOLN
INTRAMUSCULAR | Status: AC
  Filled 2021-07-24: qty 1

## 2021-07-24 MED ORDER — ACETAMINOPHEN 325 MG PO TABS: 325.0000 mg | ORAL_TABLET | ORAL | Status: DC | PRN

## 2021-07-24 MED ORDER — CHLORHEXIDINE GLUCONATE CLOTH 2 % EX PADS: 6.0000 | MEDICATED_PAD | Freq: Once | CUTANEOUS | Status: DC

## 2021-07-24 MED ORDER — ONDANSETRON HCL 4 MG/2ML IJ SOLN: 4.0000 mg | Freq: Once | INTRAMUSCULAR | Status: DC | PRN

## 2021-07-24 MED ORDER — ALBUTEROL SULFATE HFA 108 (90 BASE) MCG/ACT IN AERS
INHALATION_SPRAY | RESPIRATORY_TRACT | Status: DC | PRN
  Administered 2021-07-24: 6 via RESPIRATORY_TRACT

## 2021-07-24 MED ORDER — FENTANYL CITRATE (PF) 100 MCG/2ML IJ SOLN
INTRAMUSCULAR | Status: AC
  Filled 2021-07-24: qty 2

## 2021-07-24 MED ORDER — EPINEPHRINE PF 1 MG/ML IJ SOLN
INTRAMUSCULAR | Status: AC
  Filled 2021-07-24: qty 1

## 2021-07-24 MED ORDER — METOPROLOL TARTRATE 5 MG/5ML IV SOLN
INTRAVENOUS | Status: DC | PRN
  Administered 2021-07-24 (×2): 2.5 mg via INTRAVENOUS
  Administered 2021-07-24: 5 mg via INTRAVENOUS

## 2021-07-24 MED ORDER — FENTANYL CITRATE (PF) 250 MCG/5ML IJ SOLN
INTRAMUSCULAR | Status: AC
  Filled 2021-07-24: qty 5

## 2021-07-24 MED ORDER — GLYCOPYRROLATE PF 0.2 MG/ML IJ SOSY
PREFILLED_SYRINGE | INTRAMUSCULAR | Status: AC
  Filled 2021-07-24: qty 1

## 2021-07-24 MED ORDER — LIDOCAINE 2% (20 MG/ML) 5 ML SYRINGE
INTRAMUSCULAR | Status: AC
  Filled 2021-07-24: qty 5

## 2021-07-24 MED ORDER — LACTATED RINGERS IV SOLN: INTRAVENOUS | Status: DC

## 2021-07-24 MED ORDER — SUGAMMADEX SODIUM 200 MG/2ML IV SOLN
INTRAVENOUS | Status: DC | PRN
  Administered 2021-07-24: 300 mg via INTRAVENOUS

## 2021-07-24 MED ORDER — DEXMEDETOMIDINE (PRECEDEX) IN NS 20 MCG/5ML (4 MCG/ML) IV SYRINGE
PREFILLED_SYRINGE | INTRAVENOUS | Status: AC
  Filled 2021-07-24: qty 5

## 2021-07-24 MED ORDER — BUPIVACAINE HCL (PF) 0.25 % IJ SOLN
INTRAMUSCULAR | Status: DC | PRN
  Administered 2021-07-24: 30 mL

## 2021-07-24 MED ORDER — EPINEPHRINE PF 1 MG/ML IJ SOLN
INTRAMUSCULAR | Status: DC | PRN
  Administered 2021-07-24: 1 mg

## 2021-07-24 MED ORDER — ROCURONIUM BROMIDE 10 MG/ML (PF) SYRINGE
PREFILLED_SYRINGE | INTRAVENOUS | Status: DC | PRN
Start: 2021-07-24 — End: 2021-07-24
  Administered 2021-07-24: 60 mg via INTRAVENOUS
  Administered 2021-07-24: 20 mg via INTRAVENOUS

## 2021-07-24 MED ORDER — LACTATED RINGERS IV SOLN
INTRAVENOUS | Status: AC | PRN
Start: 2021-07-24 — End: 2021-07-24
  Administered 2021-07-24: 1000 mL via INTRAVENOUS

## 2021-07-24 MED ORDER — ONDANSETRON HCL 4 MG/2ML IJ SOLN
INTRAMUSCULAR | Status: AC
  Filled 2021-07-24: qty 2

## 2021-07-24 MED ORDER — OXYCODONE HCL 5 MG PO TABS: 5.0000 mg | ORAL_TABLET | Freq: Once | ORAL | Status: DC | PRN

## 2021-07-24 SURGICAL SUPPLY — 57 items
BAG COUNTER SPONGE SURGICOUNT (BAG) IMPLANT
BENZOIN TINCTURE PRP APPL 2/3 (GAUZE/BANDAGES/DRESSINGS) ×4 IMPLANT
BLADE SURG 10 STRL SS (BLADE) ×2 IMPLANT
BLADE SURG 15 STRL LF DISP TIS (BLADE) ×1 IMPLANT
BLADE SURG 15 STRL SS (BLADE) ×2
BNDG ELASTIC 6X15 VLCR STRL LF (GAUZE/BANDAGES/DRESSINGS) ×1 IMPLANT
BNDG ELASTIC 6X5.8 VLCR STR LF (GAUZE/BANDAGES/DRESSINGS) ×4 IMPLANT
BNDG GAUZE ELAST 4 BULKY (GAUZE/BANDAGES/DRESSINGS) ×2 IMPLANT
CHLORAPREP W/TINT 26 (MISCELLANEOUS) ×4 IMPLANT
CLIP VESOCCLUDE MED 6/CT (CLIP) IMPLANT
CLSR STERI-STRIP ANTIMIC 1/2X4 (GAUZE/BANDAGES/DRESSINGS) ×1 IMPLANT
DRAPE LAPAROSCOPIC ABDOMINAL (DRAPES) ×2 IMPLANT
DRAPE UTILITY XL STRL (DRAPES) ×2 IMPLANT
DRSG PAD ABDOMINAL 8X10 ST (GAUZE/BANDAGES/DRESSINGS) ×3 IMPLANT
ELECT BLADE 4.0 EZ CLEAN MEGAD (MISCELLANEOUS) ×2
ELECT REM PT RETURN 9FT ADLT (ELECTROSURGICAL) ×2
ELECTRODE BLDE 4.0 EZ CLN MEGD (MISCELLANEOUS) IMPLANT
ELECTRODE REM PT RTRN 9FT ADLT (ELECTROSURGICAL) ×1 IMPLANT
GAUZE SPONGE 4X4 12PLY STRL (GAUZE/BANDAGES/DRESSINGS) ×4 IMPLANT
GAUZE SPONGE 4X4 12PLY STRL LF (GAUZE/BANDAGES/DRESSINGS) ×1 IMPLANT
GAUZE XEROFORM 5X9 LF (GAUZE/BANDAGES/DRESSINGS) ×2 IMPLANT
GLOVE SRG 8 PF TXTR STRL LF DI (GLOVE) IMPLANT
GLOVE SURG ENC MOIS LTX SZ6.5 (GLOVE) IMPLANT
GLOVE SURG ENC MOIS LTX SZ7.5 (GLOVE) IMPLANT
GLOVE SURG ENC TEXT LTX SZ7.5 (GLOVE) ×2 IMPLANT
GLOVE SURG LTX SZ6.5 (GLOVE) IMPLANT
GLOVE SURG UNDER POLY LF SZ8 (GLOVE)
GOWN STRL REUS W/ TWL LRG LVL3 (GOWN DISPOSABLE) ×2 IMPLANT
GOWN STRL REUS W/TWL LRG LVL3 (GOWN DISPOSABLE) ×4
KIT BASIN OR (CUSTOM PROCEDURE TRAY) ×2 IMPLANT
MARKER SKIN DUAL TIP RULER LAB (MISCELLANEOUS) IMPLANT
NDL SPNL 18GX3.5 QUINCKE PK (NEEDLE) ×1 IMPLANT
NEEDLE SPNL 18GX3.5 QUINCKE PK (NEEDLE) ×2 IMPLANT
NS IRRIG 1000ML POUR BTL (IV SOLUTION) ×2 IMPLANT
PACK GENERAL/GYN (CUSTOM PROCEDURE TRAY) ×2 IMPLANT
PENCIL SMOKE EVACUATOR (MISCELLANEOUS) ×2 IMPLANT
SLEEVE SCD COMPRESS KNEE MED (STOCKING) IMPLANT
SPONGE T-LAP 18X18 ~~LOC~~+RFID (SPONGE) ×4 IMPLANT
STAPLER INSORB 30 2030 C-SECTI (MISCELLANEOUS) ×2 IMPLANT
STAPLER VISISTAT 35W (STAPLE) ×2 IMPLANT
STRIP CLOSURE SKIN 1/2X4 (GAUZE/BANDAGES/DRESSINGS) ×7 IMPLANT
SUT CHROMIC 4 0 PS 2 18 (SUTURE) ×5 IMPLANT
SUT ETHILON 2 0 FS 18 (SUTURE) IMPLANT
SUT ETHILON 3 0 PS 1 (SUTURE) ×5 IMPLANT
SUT MNCRL AB 4-0 PS2 18 (SUTURE) ×4 IMPLANT
SUT PDS AB 2-0 CT2 27 (SUTURE) IMPLANT
SUT PDS AB 3-0 SH 27 (SUTURE) ×2 IMPLANT
SUT VIC AB 3-0 PS1 18 (SUTURE) ×8
SUT VIC AB 3-0 PS1 18X BRD (SUTURE) IMPLANT
SUT VIC AB 3-0 PS1 18XBRD (SUTURE) ×2 IMPLANT
SUT VLOC 90 P-14 23 (SUTURE) ×4 IMPLANT
SYR 50ML LL SCALE MARK (SYRINGE) ×4 IMPLANT
SYR TB 1ML LUER SLIP (SYRINGE) ×1 IMPLANT
TAPE MEASURE VINYL STERILE (MISCELLANEOUS) IMPLANT
TOWEL GREEN STERILE FF (TOWEL DISPOSABLE) ×4 IMPLANT
TUBING INFILTRATION IT-10001 (TUBING) ×1 IMPLANT
UNDERPAD 30X36 HEAVY ABSORB (UNDERPADS AND DIAPERS) ×4 IMPLANT

## 2021-07-24 NOTE — Anesthesia Postprocedure Evaluation (Signed)
Anesthesia Post Note  Patient: Yolanda Berry  Procedure(s) Performed: MAMMARY REDUCTION  (BREAST) (Bilateral: Breast)     Patient location during evaluation: PACU Anesthesia Type: General Level of consciousness: awake and alert Pain management: pain level controlled Vital Signs Assessment: post-procedure vital signs reviewed and stable Respiratory status: spontaneous breathing, nonlabored ventilation, respiratory function stable and patient connected to nasal cannula oxygen Cardiovascular status: blood pressure returned to baseline and stable Postop Assessment: no apparent nausea or vomiting Anesthetic complications: no   No notable events documented.  Last Vitals:  Vitals:   07/24/21 1325 07/24/21 1330  BP: 118/86   Pulse: (!) 53 60  Resp: 16 16  Temp:    SpO2: 91% 93%    Last Pain:  Vitals:   07/24/21 1310  TempSrc:   PainSc: 0-No pain                 Leonell Lobdell

## 2021-07-24 NOTE — Discharge Instructions (Signed)
Activity: Avoid strenuous activity.  No heavy lifting.  Diet: No restrictions.  Try to optimize nutrition with plenty of fruits and vegetables to improve healing.  Wound Care: Leave ACE wrap for 3 days.  You may then remove and shower normally.  Leave the Steri-Strips in place.  Replace gauze as needed for bleeding.  After ACE wrap comes off recommend sports bra for gentle compression.  Avoid any bra with under-wire until cleared by Dr. Claudia Desanctis.  Follow-Up: Scheduled for next week.  Things to watch for:  Call the office if you experience fever, chills, persistent nausea, or significant bleeding.  Mild wound drainage is common after breast reduction surgery and should not be cause for alarm.

## 2021-07-24 NOTE — Anesthesia Preprocedure Evaluation (Addendum)
Anesthesia Evaluation  Patient identified by MRN, date of birth, ID band Patient awake    Reviewed: Allergy & Precautions, H&P , NPO status , Patient's Chart, lab work & pertinent test results, reviewed documented beta blocker date and time   History of Anesthesia Complications Negative for: history of anesthetic complications  Airway Mallampati: II  TM Distance: >3 FB Neck ROM: full    Dental  (+) Teeth Intact, Upper Dentures   Pulmonary neg pulmonary ROS,    Pulmonary exam normal breath sounds clear to auscultation       Cardiovascular Exercise Tolerance: Good hypertension, Pt. on medications  Rhythm:regular Rate:Normal     Neuro/Psych negative neurological ROS  negative psych ROS   GI/Hepatic negative GI ROS, Neg liver ROS,   Endo/Other  negative endocrine ROS  Renal/GU negative Renal ROS     Musculoskeletal   Abdominal   Peds  Hematology negative hematology ROS (+)   Anesthesia Other Findings   Reproductive/Obstetrics negative OB ROS                             Anesthesia Physical  Anesthesia Plan  ASA: 2  Anesthesia Plan: General ETT   Post-op Pain Management: Tylenol PO (pre-op)* and Celebrex PO (pre-op)*   Induction: Intravenous  PONV Risk Score and Plan: 3 and Ondansetron, Dexamethasone, Treatment may vary due to age or medical condition and Midazolam  Airway Management Planned: Oral ETT and LMA  Additional Equipment: None  Intra-op Plan:   Post-operative Plan: Extubation in OR  Informed Consent: I have reviewed the patients History and Physical, chart, labs and discussed the procedure including the risks, benefits and alternatives for the proposed anesthesia with the patient or authorized representative who has indicated his/her understanding and acceptance.     Dental Advisory Given  Plan Discussed with: CRNA and Anesthesiologist  Anesthesia Plan Comments:  (  )        Anesthesia Quick Evaluation

## 2021-07-24 NOTE — Op Note (Signed)
Operative Note   DATE OF OPERATION: 07/24/2021  LOCATION: [facility]   SURGICAL DEPARTMENT: Plastic Surgery  PREOPERATIVE DIAGNOSES: Bilateral symptomatic macromastia.  POSTOPERATIVE DIAGNOSES:  same  PROCEDURE: Bilateral breast reduction with superomedial pedicle.  SURGEON: Talmadge Coventry, MD  ASSISTANT: Krista Blue, PA The advanced practice practitioner (APP) assisted throughout the case.  The APP was essential in retraction and counter traction when needed to make the case progress smoothly.  This retraction and assistance made it possible to see the tissue planes for the procedure.  The assistance was needed for hemostasis, tissue re-approximation and closure of the incision site.   ANESTHESIA: General.  COMPLICATIONS: None.   INDICATIONS FOR PROCEDURE:  The patient, Charl Wellen is a 51 y.o. female born on Jul 30, 1970, is here for treatment of bilateral symptomatic macromastia. MRN: [id number]  CONSENT:  Informed consent was obtained directly from the patient. Risks, benefits and alternatives were fully discussed. Specific risks including but not limited to bleeding, infection, hematoma, seroma, scarring, pain, infection, contracture, asymmetry, wound healing problems, and need for further surgery were all discussed. The patient did have an ample opportunity to have questions answered to satisfaction.   DESCRIPTION OF PROCEDURE:  The patient was marked preoperatively for a Wise pattern skin excision.  The patient was taken to the operating room. SCDs were placed and antibiotics were given. General anesthesia was administered.The patient's operative site was prepped and draped in a sterile fashion. A time out was performed and all information was confirmed to be correct.  Right Breast: The breast was infiltrated with tumescent solution to help with hemostasis.  The nipple was marked with a cookie cutter.  A superomedial pedicle was drawn out with the base  of at least 8 cm in size.  A breast tourniquet was then applied and the pedicle was de-epithelialized.  Breast tourniquet was then let down and all incisions were made with a 10 blade.  The pedicle was then isolated down to the chest wall with cautery and the excision was performed removing tissue primarily inferiorly and laterally.  Hemostasis was obtained and the wound was stapled closed.  Left breast:  The breast was infiltrated with tumescent solution to help with hemostasis.  The nipple was marked with a cookie cutter.  A superomedial pedicle was drawn out with the base of at least 8 cm in size.  A breast tourniquet was then applied and the pedicle was de-epithelialized.  Breast tourniquet was then let down and all incisions were made with a 10 blade.  The pedicle was then isolated down to the chest wall with cautery and the excision was performed removing tissue primarily inferiorly and laterally.  Hemostasis was obtained and the wound was stapled closed.  Patient was then set up to check for size and symmetry.  Minor modifications were made.  This resulted in a total of 520g removed from the right side and 564g removed from the left side.  The inframammary incision was closed with a combination of buried in-sorb staples and a running 3-0 Quill suture.  The vertical and periareolar limbs were closed with interrupted buried 4-0 Monocryl and a running 4-0 Quill suture.  Steri-Strips were then applied along with a soft dressing and Ace wrap.  The patient tolerated the procedure well.  There were no complications. The patient was allowed to wake from anesthesia, extubated and taken to the recovery room in satisfactory condition.  I was present for the entire procedure.

## 2021-07-24 NOTE — Anesthesia Procedure Notes (Signed)
Procedure Name: Intubation Date/Time: 07/24/2021 10:10 AM Performed by: Maude Leriche, CRNA Pre-anesthesia Checklist: Patient identified, Emergency Drugs available, Suction available and Patient being monitored Patient Re-evaluated:Patient Re-evaluated prior to induction Oxygen Delivery Method: Circle system utilized Preoxygenation: Pre-oxygenation with 100% oxygen Induction Type: IV induction Ventilation: Mask ventilation without difficulty Laryngoscope Size: Miller and 2 Grade View: Grade I Tube type: Oral Tube size: 7.0 mm Number of attempts: 1 Airway Equipment and Method: Stylet and Bite block Placement Confirmation: ETT inserted through vocal cords under direct vision, positive ETCO2 and breath sounds checked- equal and bilateral Secured at: 21 cm Tube secured with: Tape Dental Injury: Teeth and Oropharynx as per pre-operative assessment

## 2021-07-24 NOTE — Transfer of Care (Signed)
Immediate Anesthesia Transfer of Care Note  Patient: Yolanda Berry  Procedure(s) Performed: MAMMARY REDUCTION  (BREAST) (Bilateral: Breast)  Patient Location: PACU  Anesthesia Type:General  Level of Consciousness: drowsy, patient cooperative and responds to stimulation  Airway & Oxygen Therapy: Patient Spontanous Breathing and Patient connected to face mask oxygen  Post-op Assessment: Report given to RN, Post -op Vital signs reviewed and stable, Patient moving all extremities X 4 and Patient able to stick tongue midline  Post vital signs: Reviewed  Last Vitals:  Vitals Value Taken Time  BP 98/77 07/24/21 1240  Temp 98.6   Pulse 59 07/24/21 1245  Resp 24 07/24/21 1245  SpO2 90 % 07/24/21 1245  Vitals shown include unvalidated device data.  Last Pain:  Vitals:   07/24/21 0800  TempSrc:   PainSc: 0-No pain         Complications: No notable events documented.

## 2021-07-24 NOTE — Interval H&P Note (Signed)
Patient seen and examined. Risks and benefits discussed. Proceed with surgery.

## 2021-07-25 ENCOUNTER — Encounter (HOSPITAL_COMMUNITY): Payer: Self-pay | Admitting: Plastic Surgery

## 2021-07-25 LAB — SURGICAL PATHOLOGY

## 2021-08-02 ENCOUNTER — Ambulatory Visit (INDEPENDENT_AMBULATORY_CARE_PROVIDER_SITE_OTHER): Payer: BC Managed Care – PPO | Admitting: Plastic Surgery

## 2021-08-02 ENCOUNTER — Other Ambulatory Visit: Payer: Self-pay

## 2021-08-02 ENCOUNTER — Encounter: Payer: Self-pay | Admitting: Plastic Surgery

## 2021-08-02 DIAGNOSIS — N62 Hypertrophy of breast: Secondary | ICD-10-CM

## 2021-08-02 NOTE — Progress Notes (Signed)
Patient presents about 1 week out from bilateral breast reduction.  She feels good and is happy.  On exam everything looks to be healing fine with intact incisions and viable nipple areolar complexes.  There is no subcutaneous fluid.  I have asked her to continue compressive garments and avoid strenuous activity.  We will see her again in a few weeks.  ?

## 2021-08-11 ENCOUNTER — Telehealth: Payer: Self-pay

## 2021-08-11 NOTE — Telephone Encounter (Signed)
Patient called to say she needs to come in tomorrow as an emergency.  She said she was changing her bandage and she realized that the skin had spread from under the tape, it busted and looks like it may be infected.  Patient said she needs a doctor to look at it. ? ?Yolanda Berry called back while I was typing in this note and I did let her know that we are closed tomorrow, but I will route this message to our clinical staff and PA. ?

## 2021-08-11 NOTE — Progress Notes (Deleted)
Patient is a 51 year old female with PMH of macromastia s/p bilateral breast reduction performed 08/14/2021 with Dr. Claudia Desanctis who presents to clinic for postoperative follow-up. ? ?She was last seen here in clinic on 08/02/2021.  At that time, exam was entirely reassuring.  Plan was for continued compressive garments and activity modifications. ? ? ?

## 2021-08-14 ENCOUNTER — Ambulatory Visit (INDEPENDENT_AMBULATORY_CARE_PROVIDER_SITE_OTHER): Payer: BC Managed Care – PPO | Admitting: Physician Assistant

## 2021-08-14 ENCOUNTER — Other Ambulatory Visit: Payer: Self-pay

## 2021-08-14 DIAGNOSIS — Z9889 Other specified postprocedural states: Secondary | ICD-10-CM

## 2021-08-14 NOTE — Progress Notes (Signed)
Patient is a 51 year old female with PMH of macromastia s/p bilateral breast reduction performed 08/14/2021 with Dr. Claudia Desanctis who presents to clinic for postoperative follow-up. ? ?She was last seen here in clinic on 08/02/2021.  At that time, exam was entirely reassuring.  Plan was for continued compressive garments and activity modifications. ? ?Today, patient reports that shortly after her last visit she felt as though her breasts were increasingly swollen.  She then started to drain spontaneously from her inframammary fold on each side.  She reports that she has had thin drainage, but wanted to ensure that there was no evidence of infection.  She does endorse discomfort related to the swelling.  She is continue to wear compressive bra 24/7 and has been modifying her activities. ? ?On exam, patient with bilateral inferior T-zone wounds.  Steri-Strips were beginning to fall off and removed here in clinic without complication or difficulty.  She has a 2 x 1 cm inferior T-zone on the right side with 0.5 cm wound at vertical limb.  On the left, she has a 1.5 x 1 cm wound at inferior T-zone.  Neither are actively draining on exam.  No surrounding erythema.  Breasts otherwise have good shape and symmetry.  No obvious seroma amenable to aspiration.  No bruising.  NAC's are viable.  No areas of crepitus or fluctuance. ? ?Recommending thin film of Vaseline to vertical limb wound on right breast as well as incisional areas that are dry appearing.  As for her inferior T-zone wounds, recommending gauze and ABD pads to help soak up any drainage.  She knows that she can also use maxipads.  This will provide comfort in addition to help facilitate continued drainage.  Replace gauze as needed.  Once her drainage has slowed, she can begin applying thin film of Vaseline once daily to help with wound healing. ? ?She has an appointment already scheduled for 08/16/2021 that she can keep in the event that symptoms worsen over the next 48 hours  and she would like reevaluation, otherwise she can cancel and return in 10 to 14 days.  Picture(s) obtained of the patient and placed in the chart were with the patient's or guardian's permission. ? ?

## 2021-08-14 NOTE — Telephone Encounter (Signed)
Patient is scheduled   

## 2021-08-14 NOTE — Telephone Encounter (Signed)
I can see this patient today, maybe around noon? Otherwise, it looks like she is already scheduled for 3/22

## 2021-08-14 NOTE — Telephone Encounter (Signed)
Yolanda Berry stated he could see the patient around noon if she didn't want to wait for her 3/22 appt. I gave information to front desk to give pt the options.  ?

## 2021-08-16 ENCOUNTER — Encounter: Payer: BC Managed Care – PPO | Admitting: Physician Assistant

## 2021-08-28 NOTE — Progress Notes (Signed)
Patient is a pleasant 51 year old female with PMH of macromastia s/p bilateral breast reduction performed 07/24/2021 with Dr. Claudia Desanctis who presents to clinic for postoperative follow-up. ? ?Patient was last seen here in clinic on 08/14/2021.  At that time, patient complained of drainage from wounds in inframammary regions bilaterally.  On exam, she had a 2 x 1 cm inferior T-zone and 0.5 cm wound at vertical limb right breast.  1.5 x 1 cm wound was noted on contralateral breast at inferior T-zone.  No active drainage on exam.  No obvious infection.  Recommendation was for Vaseline and gauze as well as ABDs for drainage.  Plan was for her to return in 10 to 14 days for reevaluation. ? ?Today, patient is doing well.  She states that the chronic shoulder pain that she had prior to her reduction has completely resolved.  She is overall quite pleased with the cosmetic outcome.  She does state that a staple appears to be poking out of her incision on the right side.  Otherwise, states that her wounds had started to heal nicely and are scabbing over.  No other changes in interim. ? ?Physical exam entirely reassuring.  Breasts with excellent shape and symmetry.  There is a white staple from above her inframammary incision on right lateral breast that was removed without complication or difficulty.  Very mild bleeding, placed gauze.  No wounds or areas of dehiscence noted.  No erythema or obvious subcutaneous fluid collections. ? ?Patient is cleared to return to work.  Discussed avoidance of submerging breasts in water for at least another 4 to 6 weeks.  She can begin scar treatments tomorrow.  No specific follow-up needed, but she can certainly call the clinic she develop any questions or concerns. ? ?Picture(s) obtained of the patient and placed in the chart were with the patient's or guardian's permission. ? ?

## 2021-08-30 ENCOUNTER — Other Ambulatory Visit: Payer: Self-pay

## 2021-08-30 ENCOUNTER — Ambulatory Visit: Payer: BC Managed Care – PPO | Admitting: Physician Assistant

## 2021-08-30 DIAGNOSIS — Z9889 Other specified postprocedural states: Secondary | ICD-10-CM

## 2021-09-21 DIAGNOSIS — D509 Iron deficiency anemia, unspecified: Secondary | ICD-10-CM | POA: Diagnosis not present

## 2021-09-21 DIAGNOSIS — E7800 Pure hypercholesterolemia, unspecified: Secondary | ICD-10-CM | POA: Diagnosis not present

## 2021-09-21 DIAGNOSIS — Z0000 Encounter for general adult medical examination without abnormal findings: Secondary | ICD-10-CM | POA: Diagnosis not present

## 2021-09-21 DIAGNOSIS — I10 Essential (primary) hypertension: Secondary | ICD-10-CM | POA: Diagnosis not present

## 2021-11-01 ENCOUNTER — Encounter: Payer: Self-pay | Admitting: Plastic Surgery

## 2021-11-01 ENCOUNTER — Ambulatory Visit (INDEPENDENT_AMBULATORY_CARE_PROVIDER_SITE_OTHER): Payer: BC Managed Care – PPO | Admitting: Plastic Surgery

## 2021-11-01 DIAGNOSIS — Z9889 Other specified postprocedural states: Secondary | ICD-10-CM

## 2021-11-01 DIAGNOSIS — M545 Low back pain, unspecified: Secondary | ICD-10-CM | POA: Diagnosis not present

## 2021-11-01 DIAGNOSIS — M546 Pain in thoracic spine: Secondary | ICD-10-CM

## 2021-11-01 MED ORDER — GABAPENTIN 100 MG PO CAPS: 100.0000 mg | ORAL_CAPSULE | Freq: Three times a day (TID) | ORAL | 3 refills | Status: AC

## 2021-11-01 MED ORDER — LIDOCAINE 4 % EX PTCH: 2.0000 | MEDICATED_PATCH | CUTANEOUS | 2 refills | Status: AC

## 2021-11-01 NOTE — Progress Notes (Signed)
   Referring Provider Dianna Rossetti, NP [phone],  [place] [address]   CC:  Chief Complaint  Patient presents with   Follow-up      Yolanda Berry is an 51 y.o. female.  HPI: Patient presents about 4 months out from bilateral breast reduction.  She is overall happy with her cosmetic result.  She complains of sharp stinging pains extending from her axilla towards her areola on both sides.  Review of Systems General: Denies fevers and chills  Physical Exam    07/24/2021    2:40 PM 07/24/2021    2:25 PM 07/24/2021    2:10 PM  Vitals with BMI  Systolic [phone]  Diastolic 82 77 71  Pulse 61 65 54    General:  No acute distress,  Alert and oriented, Non-Toxic, Normal speech and affect Examination shows well-healed incision and a great symmetric cosmetic result.  No subcutaneous fluid collections or abnormalities.  Tenderness seems to radiate from the axilla towards the areola  Assessment/Plan Patient presents with what seems like nerve related pain from her breast reduction.  I would expect this to ease out with time.  In the meantime I will plan to offer her lidocaine patches and gabapentin to see how that works.  We reviewed risks and benefits of gabapentin and the fact that it can make her drowsy so she will start by taking it at night only.  She knows that we can increase the dose if we need to.  We will plan to check back in with her in a month to see how this is helping.  All of her questions were answered.  [name] 11/01/2021, 11:31 AM

## 2021-11-02 ENCOUNTER — Ambulatory Visit: Payer: BC Managed Care – PPO | Admitting: Plastic Surgery

## 2021-12-02 ENCOUNTER — Telehealth: Payer: Self-pay | Admitting: Plastic Surgery

## 2021-12-02 NOTE — Telephone Encounter (Signed)
Called patient but unable to leave a message.

## 2021-12-03 ENCOUNTER — Encounter: Payer: Self-pay | Admitting: Plastic Surgery

## 2021-12-06 ENCOUNTER — Ambulatory Visit: Payer: BC Managed Care – PPO | Admitting: Physician Assistant

## 2022-03-20 DIAGNOSIS — I10 Essential (primary) hypertension: Secondary | ICD-10-CM | POA: Diagnosis not present

## 2022-03-20 DIAGNOSIS — E7800 Pure hypercholesterolemia, unspecified: Secondary | ICD-10-CM | POA: Diagnosis not present

## 2022-03-20 DIAGNOSIS — Z5181 Encounter for therapeutic drug level monitoring: Secondary | ICD-10-CM | POA: Diagnosis not present

## 2022-03-20 DIAGNOSIS — G8929 Other chronic pain: Secondary | ICD-10-CM | POA: Diagnosis not present

## 2022-05-01 DIAGNOSIS — R300 Dysuria: Secondary | ICD-10-CM | POA: Diagnosis not present

## 2022-05-01 DIAGNOSIS — E7800 Pure hypercholesterolemia, unspecified: Secondary | ICD-10-CM | POA: Diagnosis not present

## 2022-05-01 DIAGNOSIS — I10 Essential (primary) hypertension: Secondary | ICD-10-CM | POA: Diagnosis not present

## 2022-05-01 DIAGNOSIS — G8929 Other chronic pain: Secondary | ICD-10-CM | POA: Diagnosis not present

## 2022-06-29 ENCOUNTER — Other Ambulatory Visit: Payer: Self-pay | Admitting: Physician Assistant

## 2022-06-29 DIAGNOSIS — N644 Mastodynia: Secondary | ICD-10-CM

## 2022-06-29 DIAGNOSIS — R718 Other abnormality of red blood cells: Secondary | ICD-10-CM | POA: Diagnosis not present

## 2022-06-29 DIAGNOSIS — L910 Hypertrophic scar: Secondary | ICD-10-CM | POA: Diagnosis not present

## 2022-08-16 ENCOUNTER — Ambulatory Visit
Admission: RE | Admit: 2022-08-16 | Discharge: 2022-08-16 | Disposition: A | Discharge status: Home / Self Care | Payer: BC Managed Care – PPO | Source: Ambulatory Visit | Attending: Physician Assistant | Admitting: Physician Assistant

## 2022-08-16 ENCOUNTER — Ambulatory Visit: Payer: BC Managed Care – PPO

## 2022-08-16 DIAGNOSIS — N644 Mastodynia: Secondary | ICD-10-CM

## 2022-08-30 IMAGING — MG MM DIGITAL DIAGNOSTIC UNILAT*R* W/ TOMO W/ CAD
6 series · 6 of 18 positions shown · non-contrast
Comparison: Previous exam(s).

CLINICAL DATA: Palpable lump in the right breast

EXAM:
DIGITAL DIAGNOSTIC UNILATERAL RIGHT MAMMOGRAM WITH TOMOSYNTHESIS AND
CAD; ULTRASOUND RIGHT BREAST LIMITED
TECHNIQUE: Right digital diagnostic mammography and breast tomosynthesis was
performed. The images were evaluated with computer-aided detection.;
Targeted ultrasound examination of the right breast was performed

[R TAN synth-2D]
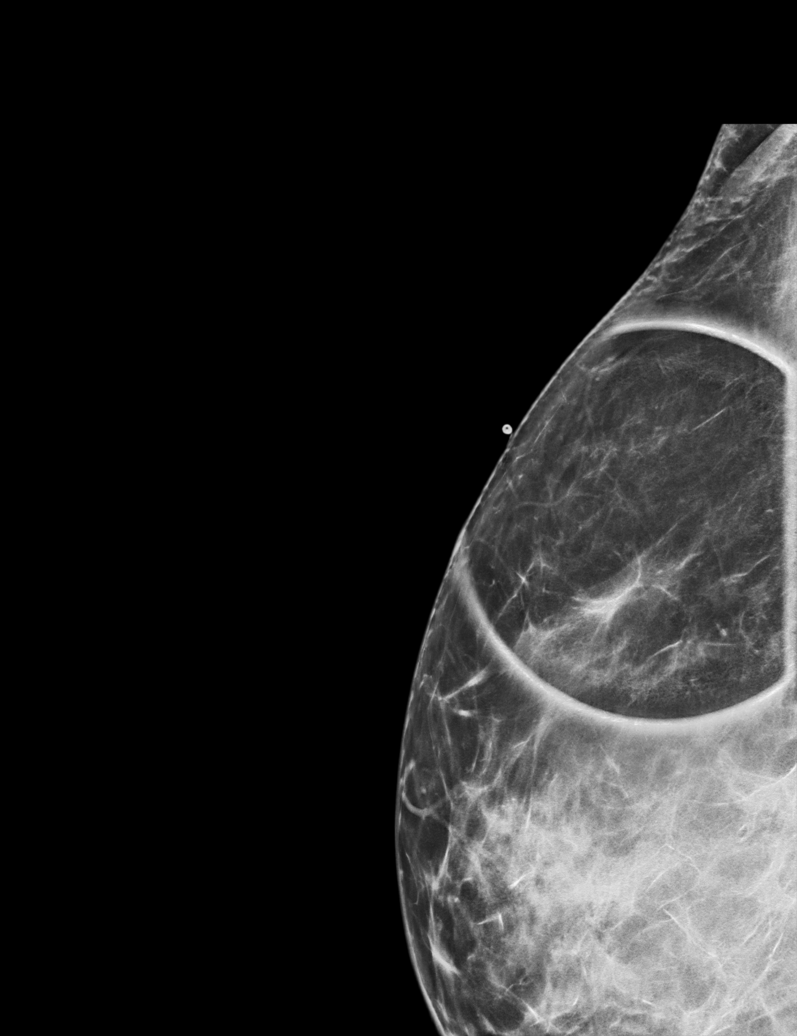

[R CC synth-2D]
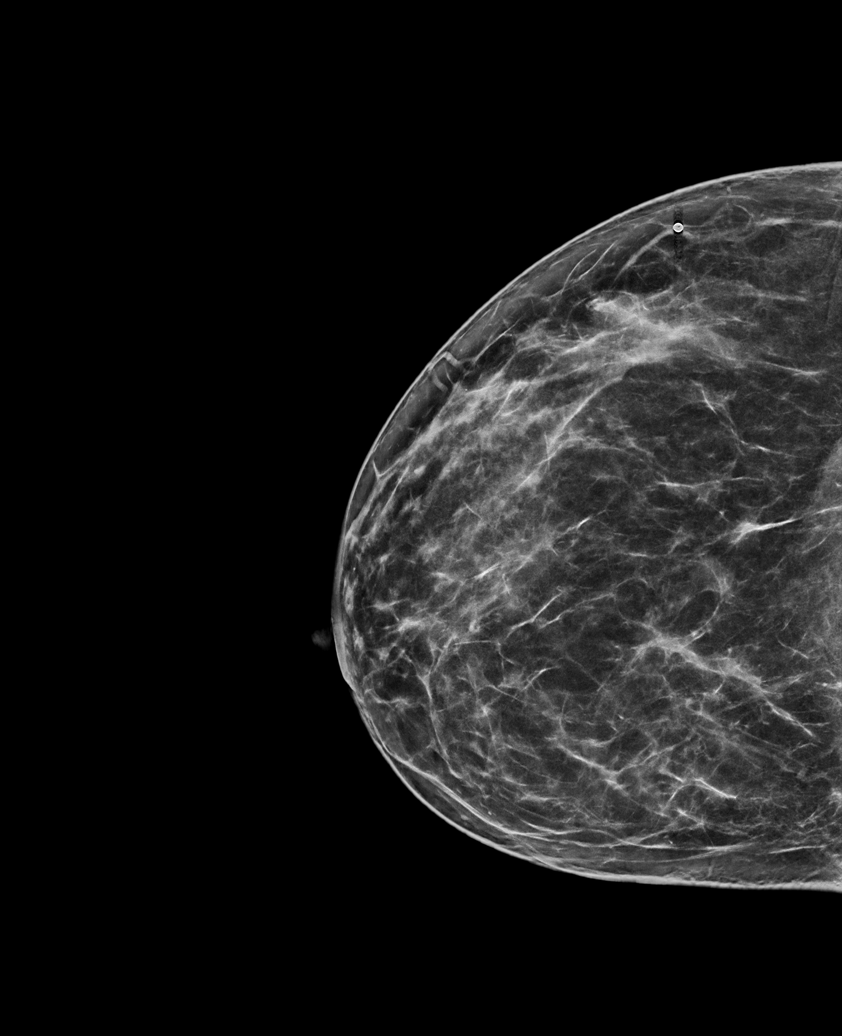

[R MLO synth-2D]
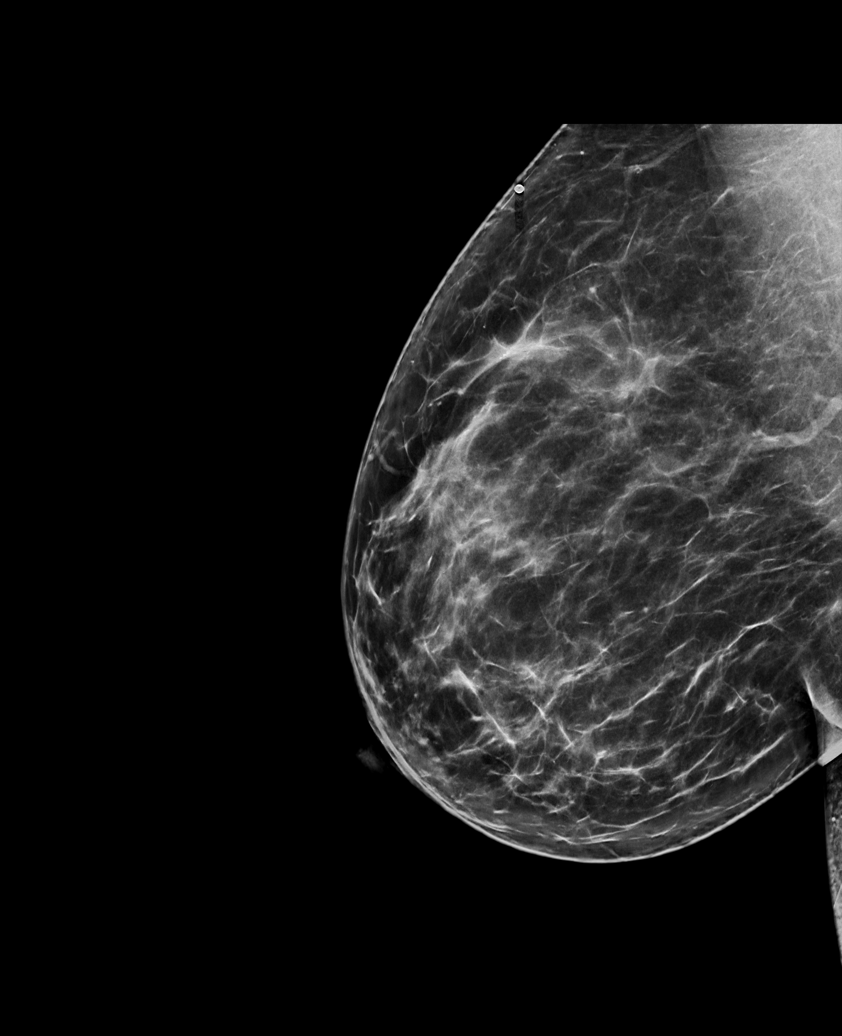

[R MLO tomo · tomo slice 43/85.0]
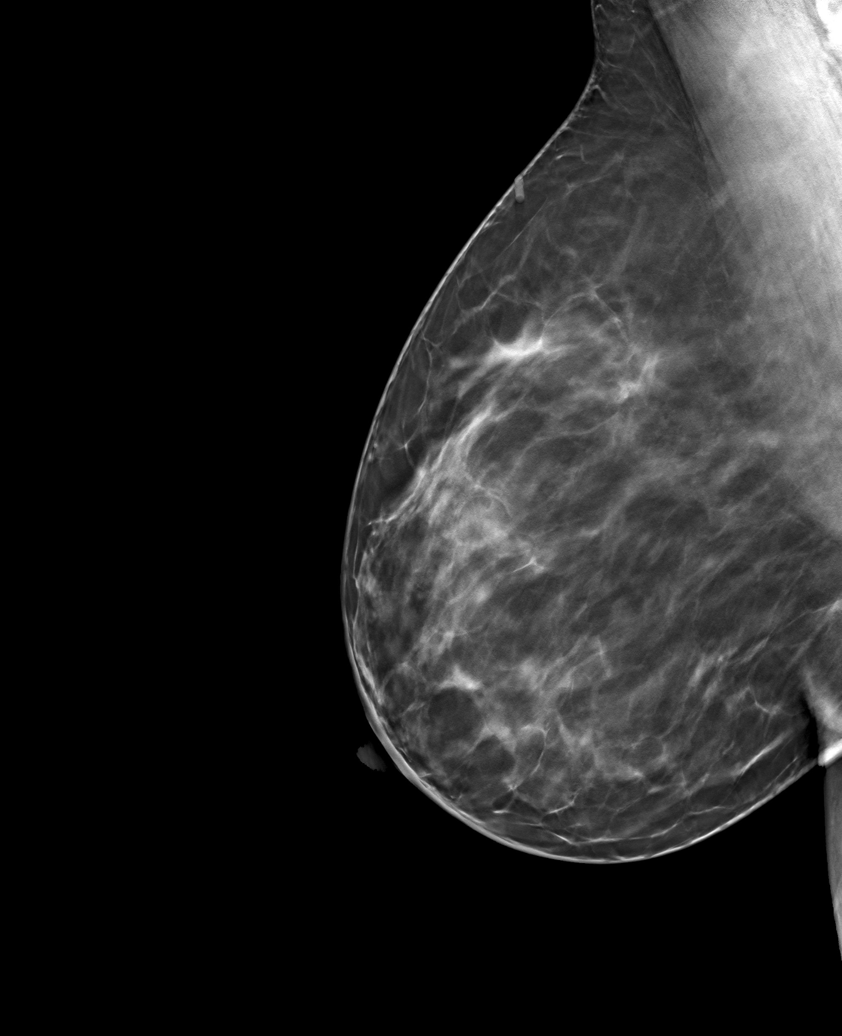

[R CC tomo · tomo slice 39/77.0]
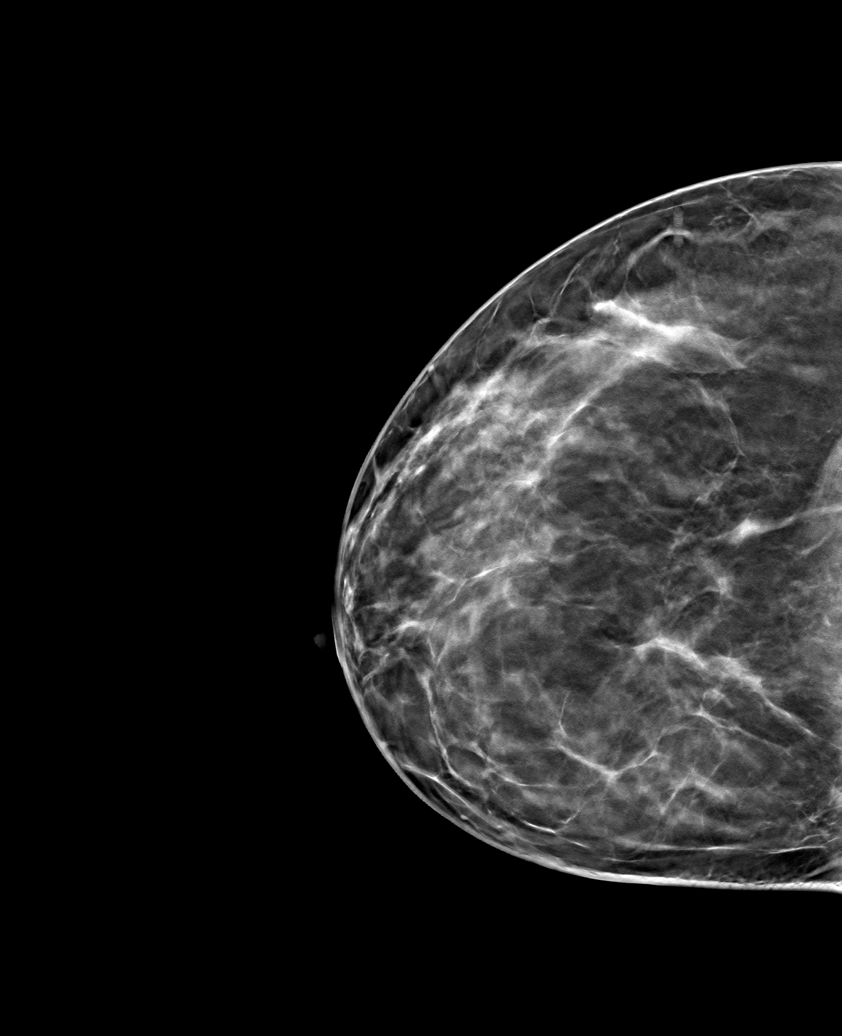

[R TAN tomo · tomo slice 37/73.0]
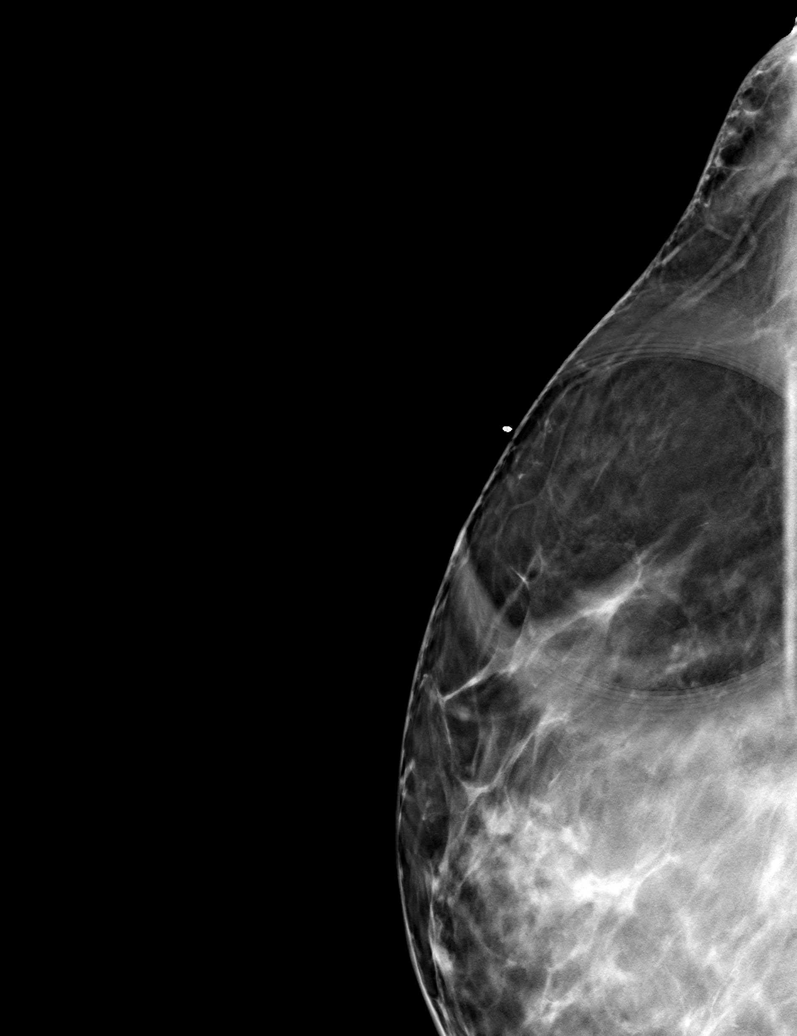

[6 of 18 positions shown; findings below may reference images not displayed]

ACR Breast Density Category b: There are scattered areas of
fibroglandular density.
FINDINGS: No suspicious masses, calcifications, or distortion.

On physical exam, no suspicious lumps are identified.

Targeted ultrasound is performed, showing no sonographic
abnormalities in the region of the patient's lump.
IMPRESSION: No mammographic or sonographic evidence of malignancy.

RECOMMENDATION:
Treatment of the patient's symptoms should be based on clinical and
physical exam given lack of imaging findings. Recommend annual
screening mammography next due in Wednesday August, 2020. The patient's last
screening mammogram was September 17, 2019.

I have discussed the findings and recommendations with the patient.
If applicable, a reminder letter will be sent to the patient
regarding the next appointment.

BI-RADS CATEGORY  1: Negative.

## 2022-10-03 DIAGNOSIS — L910 Hypertrophic scar: Secondary | ICD-10-CM | POA: Diagnosis not present

## 2022-10-03 DIAGNOSIS — R208 Other disturbances of skin sensation: Secondary | ICD-10-CM | POA: Diagnosis not present

## 2022-10-18 DIAGNOSIS — R208 Other disturbances of skin sensation: Secondary | ICD-10-CM | POA: Diagnosis not present

## 2022-10-18 DIAGNOSIS — L910 Hypertrophic scar: Secondary | ICD-10-CM | POA: Diagnosis not present

## 2023-04-05 DIAGNOSIS — M542 Cervicalgia: Secondary | ICD-10-CM | POA: Diagnosis not present

## 2023-04-05 DIAGNOSIS — I10 Essential (primary) hypertension: Secondary | ICD-10-CM | POA: Diagnosis not present

## 2023-04-05 DIAGNOSIS — R519 Headache, unspecified: Secondary | ICD-10-CM | POA: Diagnosis not present

## 2023-04-06 DIAGNOSIS — I10 Essential (primary) hypertension: Secondary | ICD-10-CM | POA: Diagnosis not present

## 2023-04-10 DIAGNOSIS — M5412 Radiculopathy, cervical region: Secondary | ICD-10-CM | POA: Diagnosis not present

## 2023-04-10 DIAGNOSIS — I10 Essential (primary) hypertension: Secondary | ICD-10-CM | POA: Diagnosis not present

## 2023-04-10 DIAGNOSIS — R932 Abnormal findings on diagnostic imaging of liver and biliary tract: Secondary | ICD-10-CM | POA: Diagnosis not present

## 2023-04-10 DIAGNOSIS — R350 Frequency of micturition: Secondary | ICD-10-CM | POA: Diagnosis not present

## 2023-05-08 DIAGNOSIS — M5412 Radiculopathy, cervical region: Secondary | ICD-10-CM | POA: Diagnosis not present

## 2023-05-08 DIAGNOSIS — E7800 Pure hypercholesterolemia, unspecified: Secondary | ICD-10-CM | POA: Diagnosis not present

## 2023-05-08 DIAGNOSIS — D509 Iron deficiency anemia, unspecified: Secondary | ICD-10-CM | POA: Diagnosis not present

## 2023-05-08 DIAGNOSIS — Z0000 Encounter for general adult medical examination without abnormal findings: Secondary | ICD-10-CM | POA: Diagnosis not present

## 2023-05-08 DIAGNOSIS — I10 Essential (primary) hypertension: Secondary | ICD-10-CM | POA: Diagnosis not present

## 2023-05-08 DIAGNOSIS — Z23 Encounter for immunization: Secondary | ICD-10-CM | POA: Diagnosis not present

## 2023-07-22 ENCOUNTER — Other Ambulatory Visit: Payer: Self-pay | Admitting: Physician Assistant

## 2023-07-22 DIAGNOSIS — Z1231 Encounter for screening mammogram for malignant neoplasm of breast: Secondary | ICD-10-CM

## 2023-09-24 ENCOUNTER — Telehealth: Admitting: Family Medicine

## 2023-09-24 DIAGNOSIS — J069 Acute upper respiratory infection, unspecified: Secondary | ICD-10-CM

## 2023-09-24 MED ORDER — BENZONATATE 100 MG PO CAPS: 100.0000 mg | ORAL_CAPSULE | Freq: Three times a day (TID) | ORAL | 0 refills | Status: AC | PRN

## 2023-09-24 MED ORDER — IPRATROPIUM BROMIDE 0.03 % NA SOLN: 2.0000 | Freq: Two times a day (BID) | NASAL | 0 refills | Status: AC

## 2023-09-24 NOTE — Progress Notes (Signed)

## 2024-02-28 ENCOUNTER — Ambulatory Visit

## 2024-02-28 ENCOUNTER — Ambulatory Visit
Admission: RE | Admit: 2024-02-28 | Discharge: 2024-02-28 | Disposition: A | Discharge status: Home / Self Care | Source: Ambulatory Visit | Attending: Physician Assistant | Admitting: Physician Assistant

## 2024-02-28 DIAGNOSIS — Z1231 Encounter for screening mammogram for malignant neoplasm of breast: Secondary | ICD-10-CM
